# Patient Record
Sex: Female | Born: 1935 | Race: White | Hispanic: No | Smoking: Never smoker
Health system: Southern US, Community
[De-identification: ages and names within clinical notes are randomized; demographics above are authoritative.]

## PROBLEM LIST (undated history)

## (undated) DIAGNOSIS — E039 Hypothyroidism, unspecified: Secondary | ICD-10-CM

## (undated) DIAGNOSIS — Z86711 Personal history of pulmonary embolism: Secondary | ICD-10-CM

## (undated) DIAGNOSIS — S022XXA Fracture of nasal bones, initial encounter for closed fracture: Secondary | ICD-10-CM

## (undated) DIAGNOSIS — I480 Paroxysmal atrial fibrillation: Secondary | ICD-10-CM

## (undated) DIAGNOSIS — Z87898 Personal history of other specified conditions: Secondary | ICD-10-CM

## (undated) DIAGNOSIS — S32000A Wedge compression fracture of unspecified lumbar vertebra, initial encounter for closed fracture: Secondary | ICD-10-CM

## (undated) DIAGNOSIS — J8410 Pulmonary fibrosis, unspecified: Secondary | ICD-10-CM

## (undated) DIAGNOSIS — I272 Pulmonary hypertension, unspecified: Secondary | ICD-10-CM

## (undated) DIAGNOSIS — R296 Repeated falls: Secondary | ICD-10-CM

## (undated) HISTORY — DX: Wedge compression fracture of unspecified lumbar vertebra, initial encounter for closed fracture: S32.000A

## (undated) HISTORY — DX: Pulmonary hypertension, unspecified: I27.20

## (undated) HISTORY — DX: Personal history of pulmonary embolism: Z86.711

## (undated) HISTORY — DX: Hypothyroidism, unspecified: E03.9

## (undated) HISTORY — PX: CHOLECYSTECTOMY: SHX55

## (undated) HISTORY — DX: Repeated falls: R29.6

## (undated) HISTORY — DX: Pulmonary fibrosis, unspecified: J84.10

## (undated) HISTORY — DX: Personal history of other specified conditions: Z87.898

## (undated) HISTORY — DX: Paroxysmal atrial fibrillation: I48.0

## (undated) HISTORY — PX: APPENDECTOMY: SHX54

## (undated) HISTORY — DX: Fracture of nasal bones, initial encounter for closed fracture: S02.2XXA

## (undated) HISTORY — PX: OTHER SURGICAL HISTORY: SHX169

## (undated) HISTORY — PX: TOTAL ABDOMINAL HYSTERECTOMY: SHX209

---

## 2000-11-09 ENCOUNTER — Ambulatory Visit (HOSPITAL_COMMUNITY): Admission: RE | Admit: 2000-11-09 | Discharge: 2000-11-10 | Payer: Self-pay | Admitting: Ophthalmology

## 2000-11-09 ENCOUNTER — Encounter: Payer: Self-pay | Admitting: Ophthalmology

## 2001-08-24 HISTORY — PX: CRANIOTOMY: SHX93

## 2002-03-21 ENCOUNTER — Inpatient Hospital Stay (HOSPITAL_COMMUNITY): Admission: EM | Admit: 2002-03-21 | Discharge: 2002-04-18 | Payer: Self-pay | Admitting: *Deleted

## 2002-03-21 ENCOUNTER — Encounter: Payer: Self-pay | Admitting: Neurosurgery

## 2002-03-22 ENCOUNTER — Encounter: Payer: Self-pay | Admitting: Neurosurgery

## 2002-03-23 ENCOUNTER — Encounter: Payer: Self-pay | Admitting: Neurosurgery

## 2002-03-25 ENCOUNTER — Encounter: Payer: Self-pay | Admitting: Neurosurgery

## 2002-03-27 ENCOUNTER — Encounter: Payer: Self-pay | Admitting: Neurosurgery

## 2002-03-30 ENCOUNTER — Encounter: Payer: Self-pay | Admitting: Neurosurgery

## 2002-04-04 ENCOUNTER — Encounter: Payer: Self-pay | Admitting: Neurosurgery

## 2002-04-04 ENCOUNTER — Encounter: Payer: Self-pay | Admitting: Pulmonary Disease

## 2002-04-05 ENCOUNTER — Encounter: Payer: Self-pay | Admitting: Pulmonary Disease

## 2002-04-05 ENCOUNTER — Encounter: Payer: Self-pay | Admitting: Neurosurgery

## 2002-04-06 ENCOUNTER — Encounter: Payer: Self-pay | Admitting: Neurosurgery

## 2002-04-07 ENCOUNTER — Encounter: Payer: Self-pay | Admitting: Critical Care Medicine

## 2002-04-10 ENCOUNTER — Encounter: Payer: Self-pay | Admitting: Critical Care Medicine

## 2002-04-11 ENCOUNTER — Encounter: Payer: Self-pay | Admitting: Critical Care Medicine

## 2002-04-11 ENCOUNTER — Encounter: Payer: Self-pay | Admitting: Neurosurgery

## 2002-04-12 ENCOUNTER — Encounter: Payer: Self-pay | Admitting: Critical Care Medicine

## 2002-04-13 ENCOUNTER — Encounter: Payer: Self-pay | Admitting: Neurosurgery

## 2002-04-13 ENCOUNTER — Encounter: Payer: Self-pay | Admitting: Critical Care Medicine

## 2002-04-18 ENCOUNTER — Inpatient Hospital Stay (HOSPITAL_COMMUNITY)
Admission: RE | Admit: 2002-04-18 | Discharge: 2002-05-05 | Payer: Self-pay | Admitting: Physical Medicine & Rehabilitation

## 2002-04-25 ENCOUNTER — Encounter: Payer: Self-pay | Admitting: Physical Medicine & Rehabilitation

## 2002-04-28 ENCOUNTER — Encounter: Payer: Self-pay | Admitting: Physical Medicine & Rehabilitation

## 2004-10-28 ENCOUNTER — Encounter: Admission: RE | Admit: 2004-10-28 | Discharge: 2004-10-28 | Payer: Self-pay | Admitting: Neurosurgery

## 2005-11-23 ENCOUNTER — Ambulatory Visit: Payer: Self-pay | Admitting: Cardiology

## 2005-11-26 ENCOUNTER — Ambulatory Visit: Payer: Self-pay | Admitting: Cardiology

## 2005-12-08 ENCOUNTER — Ambulatory Visit: Payer: Self-pay | Admitting: Cardiology

## 2007-03-21 ENCOUNTER — Ambulatory Visit: Payer: Self-pay | Admitting: Family Medicine

## 2007-03-21 DIAGNOSIS — R0609 Other forms of dyspnea: Secondary | ICD-10-CM | POA: Insufficient documentation

## 2007-03-21 DIAGNOSIS — R030 Elevated blood-pressure reading, without diagnosis of hypertension: Secondary | ICD-10-CM | POA: Insufficient documentation

## 2007-03-21 DIAGNOSIS — R5383 Other fatigue: Secondary | ICD-10-CM

## 2007-03-21 DIAGNOSIS — R5381 Other malaise: Secondary | ICD-10-CM | POA: Insufficient documentation

## 2007-03-21 DIAGNOSIS — R0989 Other specified symptoms and signs involving the circulatory and respiratory systems: Secondary | ICD-10-CM

## 2007-03-22 ENCOUNTER — Ambulatory Visit (HOSPITAL_COMMUNITY): Admission: RE | Admit: 2007-03-22 | Discharge: 2007-03-22 | Payer: Self-pay | Admitting: Family Medicine

## 2007-03-22 ENCOUNTER — Telehealth (INDEPENDENT_AMBULATORY_CARE_PROVIDER_SITE_OTHER): Payer: Self-pay | Admitting: *Deleted

## 2007-03-22 ENCOUNTER — Encounter (INDEPENDENT_AMBULATORY_CARE_PROVIDER_SITE_OTHER): Payer: Self-pay | Admitting: Family Medicine

## 2007-03-24 ENCOUNTER — Telehealth (INDEPENDENT_AMBULATORY_CARE_PROVIDER_SITE_OTHER): Payer: Self-pay | Admitting: *Deleted

## 2007-03-24 ENCOUNTER — Encounter (INDEPENDENT_AMBULATORY_CARE_PROVIDER_SITE_OTHER): Payer: Self-pay | Admitting: Family Medicine

## 2007-03-25 ENCOUNTER — Encounter (INDEPENDENT_AMBULATORY_CARE_PROVIDER_SITE_OTHER): Payer: Self-pay | Admitting: Family Medicine

## 2007-03-28 ENCOUNTER — Ambulatory Visit: Payer: Self-pay | Admitting: Family Medicine

## 2007-03-28 DIAGNOSIS — I82409 Acute embolism and thrombosis of unspecified deep veins of unspecified lower extremity: Secondary | ICD-10-CM | POA: Insufficient documentation

## 2007-03-28 DIAGNOSIS — I270 Primary pulmonary hypertension: Secondary | ICD-10-CM

## 2007-03-28 DIAGNOSIS — E079 Disorder of thyroid, unspecified: Secondary | ICD-10-CM | POA: Insufficient documentation

## 2007-03-28 LAB — CONVERTED CEMR LAB
HDL goal, serum: 40 mg/dL
LDL Goal: 160 mg/dL

## 2007-03-29 ENCOUNTER — Encounter (INDEPENDENT_AMBULATORY_CARE_PROVIDER_SITE_OTHER): Payer: Self-pay | Admitting: Family Medicine

## 2007-03-29 ENCOUNTER — Telehealth (INDEPENDENT_AMBULATORY_CARE_PROVIDER_SITE_OTHER): Payer: Self-pay | Admitting: *Deleted

## 2007-03-29 LAB — CONVERTED CEMR LAB
T3, Free: 2.9 pg/mL (ref 2.3–4.2)
TSH: 8.585 microintl units/mL — ABNORMAL HIGH (ref 0.350–5.50)

## 2007-04-04 ENCOUNTER — Encounter (INDEPENDENT_AMBULATORY_CARE_PROVIDER_SITE_OTHER): Payer: Self-pay | Admitting: Family Medicine

## 2007-04-04 ENCOUNTER — Ambulatory Visit (HOSPITAL_COMMUNITY): Admission: RE | Admit: 2007-04-04 | Discharge: 2007-04-04 | Payer: Self-pay | Admitting: *Deleted

## 2007-04-12 ENCOUNTER — Telehealth (INDEPENDENT_AMBULATORY_CARE_PROVIDER_SITE_OTHER): Payer: Self-pay | Admitting: Family Medicine

## 2007-04-20 ENCOUNTER — Encounter (INDEPENDENT_AMBULATORY_CARE_PROVIDER_SITE_OTHER): Payer: Self-pay | Admitting: Family Medicine

## 2007-05-11 ENCOUNTER — Encounter (INDEPENDENT_AMBULATORY_CARE_PROVIDER_SITE_OTHER): Payer: Self-pay | Admitting: Family Medicine

## 2007-06-27 ENCOUNTER — Telehealth (INDEPENDENT_AMBULATORY_CARE_PROVIDER_SITE_OTHER): Payer: Self-pay | Admitting: Family Medicine

## 2009-04-10 IMAGING — CR DG CHEST 2V
2 series · 2 of 2 positions shown · non-contrast
Comparison: none

HISTORY: Dyspnea on exertion

[view not recorded (1 of 2)]
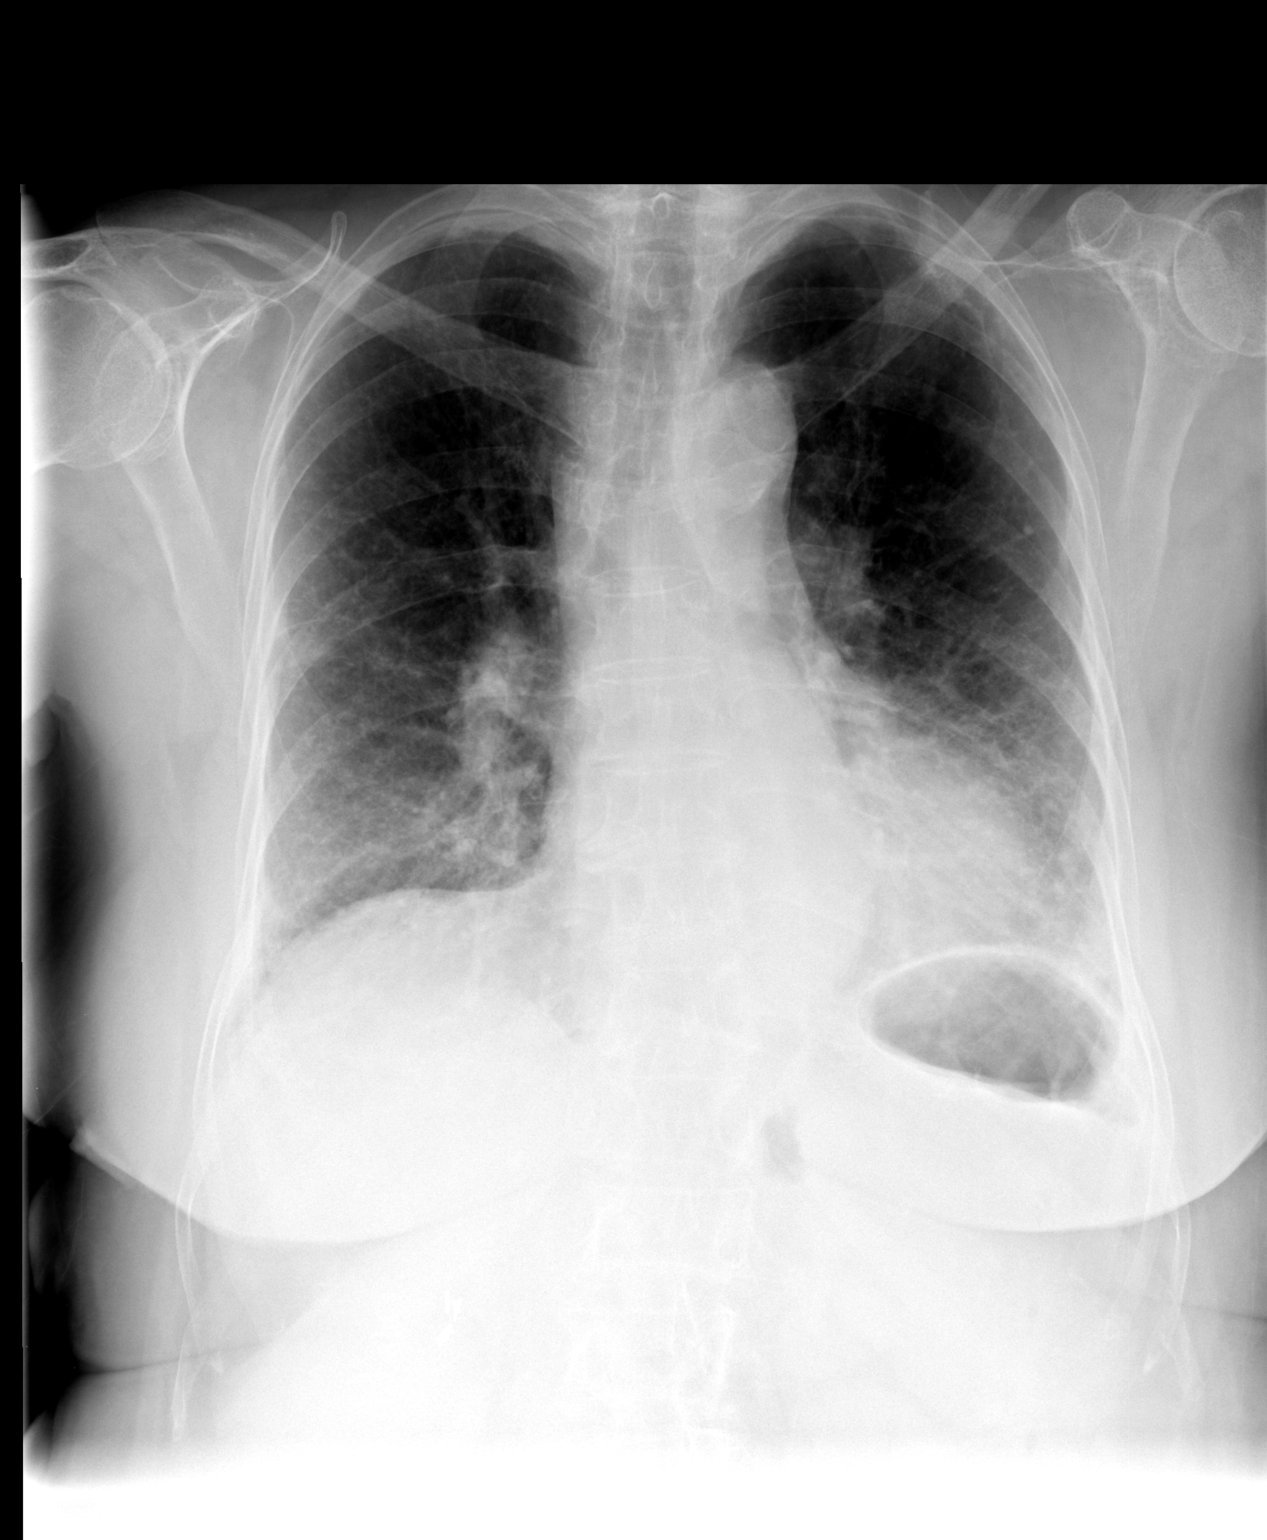

[view not recorded (2 of 2)]
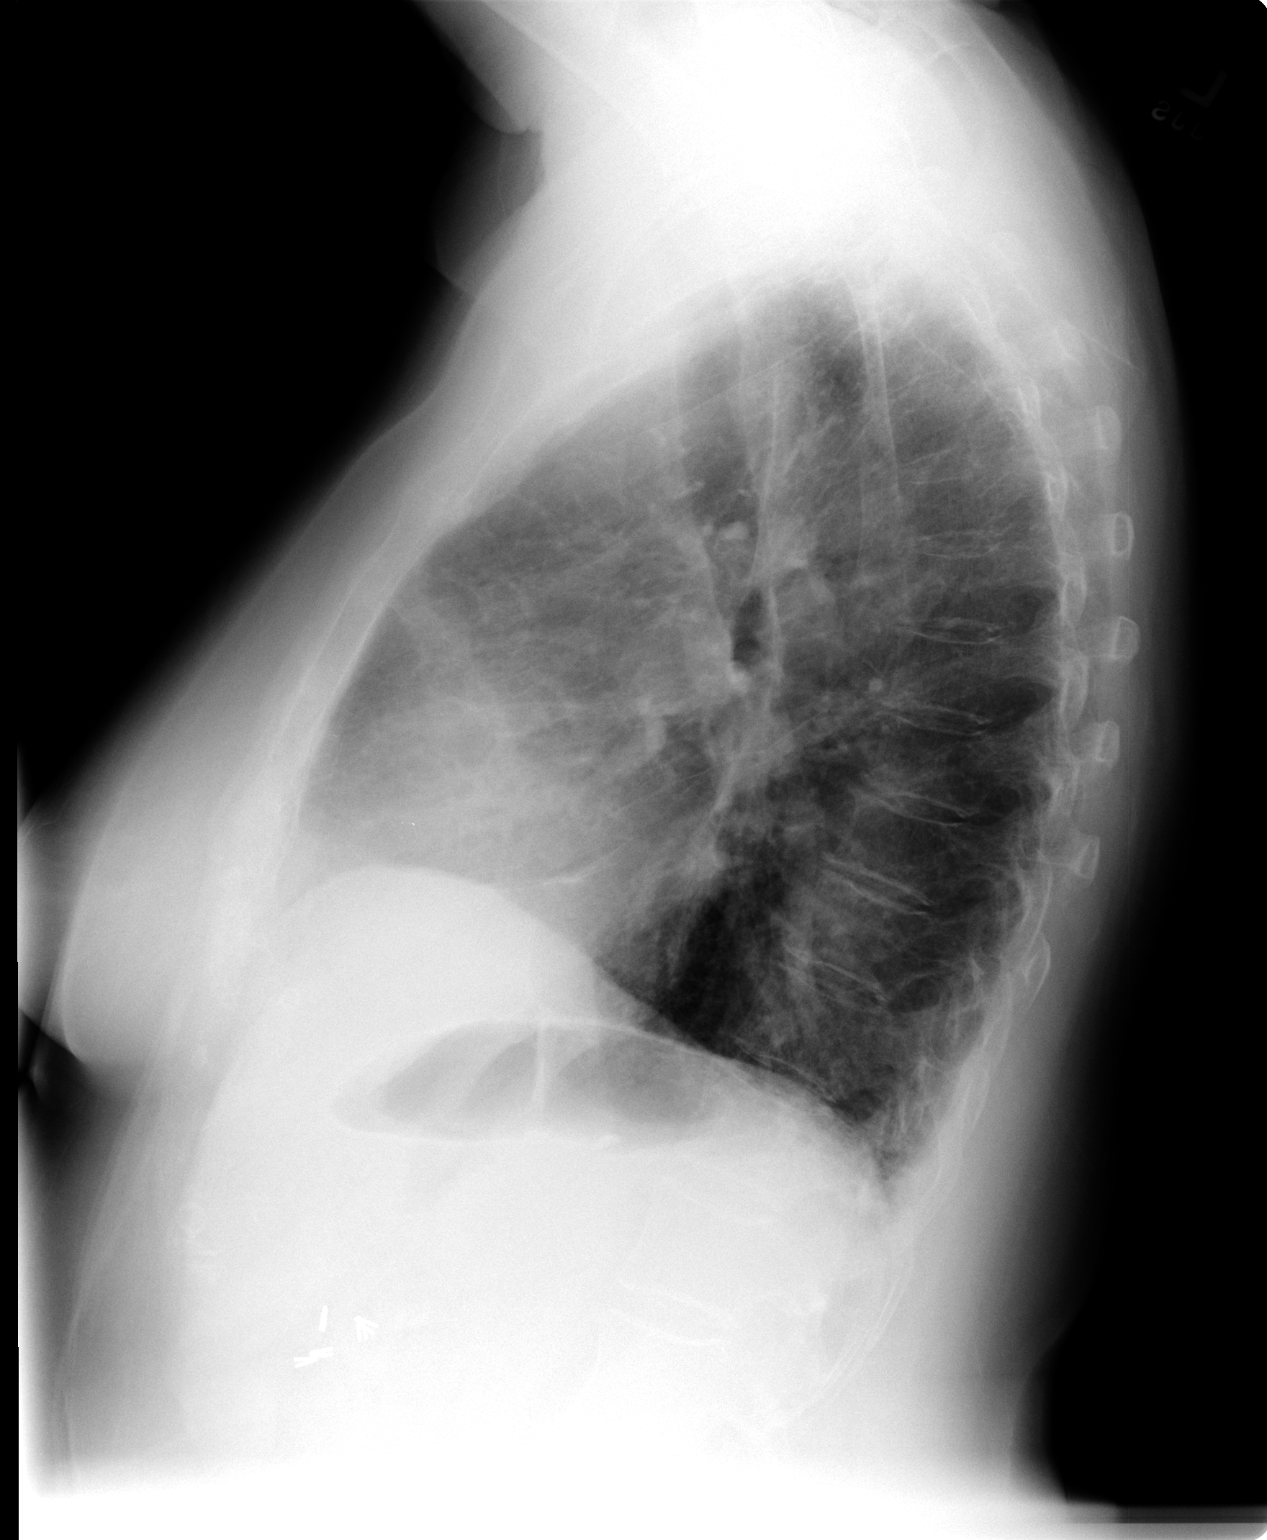

[2 of 2 positions shown; findings below may reference images not displayed]

CHEST 2 VIEWS:

No prior exams available for comparison.

Cardiac enlargement.
Tortuous aorta with atherosclerotic calcification at arch.
Mediastinal contours otherwise normal.
Slight pulmonary vascular congestion.
Mild interstitial infiltrates in perihilar and basilar regions, question edema
versus infection.
No pleural effusion or pneumothorax.
Calcified granuloma left upper lobe.
Left apex scarring.
Bony demineralization with minimal scoliosis.
IMPRESSION: Cardiomegaly with pulmonary vascular congestion and diffuse interstitial
infiltrates, question edema versus infection.

## 2010-09-21 LAB — CONVERTED CEMR LAB
AST: 22 units/L (ref 0–37)
Albumin: 3.8 g/dL (ref 3.5–5.2)
BUN: 23 mg/dL (ref 6–23)
CO2: 28 meq/L (ref 19–32)
Calcium: 9.2 mg/dL (ref 8.4–10.5)
Chloride: 105 meq/L (ref 96–112)
Cholesterol: 179 mg/dL (ref 0–200)
Creatinine, Ser: 0.66 mg/dL (ref 0.40–1.20)
Glucose, Bld: 96 mg/dL (ref 70–99)
Hemoglobin: 13.8 g/dL (ref 12.0–15.0)
LDL Cholesterol: 114 mg/dL — ABNORMAL HIGH (ref 0–99)
Lymphs Abs: 2 10*3/uL (ref 0.7–3.3)
Monocytes Absolute: 0.6 10*3/uL (ref 0.2–0.7)
Monocytes Relative: 12 % — ABNORMAL HIGH (ref 3–11)
Neutro Abs: 2.1 10*3/uL (ref 1.7–7.7)
Neutrophils Relative %: 42 % — ABNORMAL LOW (ref 43–77)
Potassium: 4 meq/L (ref 3.5–5.3)
Pro B Natriuretic peptide (BNP): 55.3 pg/mL (ref 0.0–100.0)
RBC: 4.41 M/uL (ref 3.87–5.11)
Triglycerides: 115 mg/dL (ref <150)
WBC: 4.9 10*3/uL (ref 4.0–10.5)

## 2011-01-09 NOTE — Discharge Summary (Signed)
[place]. [facility]  Patient:    Crystal Cisneros, Crystal Cisneros                      MRN: [id number] Adm. Date:  [id number] Disc. Date: [id number] Attending:  Ivor Messier CC:         Antony Salmon. Celene Skeen, M.D., [address], [address] [place], [place] [address]             Delon Sacramento, M.D.   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  HOSPITAL COURSE:  The patient was evaluated preoperatively and felt to be in satisfactory condition for the purposed surgery. The patient, therefore, was taken into the operating room where, under general anesthesia, a posterior vitrectomy was performed with associated membrane peeling and endolaser photocoagulation around several areas of picked retinal picks. A diaphanous membrane was lifted from the posterior retinal surface and removed. A vitreous air exchange was performed. Following the hour and a half procedure the patient was transferred to the recovery room and subsequently to the 23 hour observation unit. The patient was placed on her right side or face down following this vitreous air exchange. The patient was seen on the evening of surgery and felt to be cooperating with the positioning. Indirect ophthalmoscopy revealed an air filled eye and the retinal in place with laser endophotocoagulation burns around the areas of retinal picks outside the macula. The patient was again seen on the morning following surgery and, therefore, discharged home to be followed in the office. The patient and her husband have been given a printed list of discharge instructions on the care and use of the operated eye.  DISCHARGE OCULAR MEDICATIONS:  Tobradex and Cyclomydril ophthalmic solution one drop four times a day 5 minute apart. Maxitrol and Atropine  ointment at bed time.  CONDITION ON DISCHARGE:  ? improved. Time will tell whether visual function will be restored in this patient with the removal of her epiretinal membrane.  FOLLOWUP:  Appointment in my office in five to seven days. The patient to continue face down on right side positioning. DD:  11/09/00 TD:  11/10/00 Job: [id number]

## 2011-01-09 NOTE — H&P (Signed)
[place]. [facility]  Patient:    Crystal Cisneros, Crystal Cisneros                      MRN: [id number] Adm. Date:  [id number] Disc. Date: [id number] Attending:  Ivor Messier CC:         Antony Salmon. Celene Skeen, M.D., [address], [address] [place], [place] [address]             Delon Sacramento, M.D.   History and Physical  HISTORY OF PRESENT ILLNESS:  This patient has a past history of cataract implant surgery of the left eye on October 08, 1998 by her regular ophthalmologist, Dr. Glenford Peers of [place]. 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 The patient was first seen in my office on December 16, 1998. At that time, her vision was recorded at 20/40 right eye, 20/50 left eye; with correction 20/30 right eye, 20/50 left eye with head turn. Near vision 20/50 left eye. Applanation tonometry 15 mm right eye, 16 left eye. Slit lamp examination revealed nuclear cataract formation in the right eye and a clear posterior chamber intraocular lens implant in the left eye. The posterior capsule was clear. Detailed fundus examination with indirect ophthalmoscopy and Goldman three mirror contact lens biomicroscopy revealed the vitreous to be clear, the retina attached. The right eye revealed a small one disk diameter choroidal nevus inferior to the macula. The left eye revealed a glassy macula with preretinal membrane formation. It was felt that cystoid macular edema was not present. It was suggested that fluorescein angiography be performed, and this revealed no cystoid macular edema pattern. Over the ensuing year the vision in the left eye continued to deteriorate to 20/100 with best correction. It was elected, therefore, in view of the decreasing vision, to discuss vitrectomy with preretinal membrane peeling with the patient. She elected to  proceed with the surgery, as outlined, understanding the possible complications. She signed an informed consent and arrangements were made for outpatient admission, at this time.  PAST MEDICAL HISTORY:  The patient is in stable general health under the care of Dr. Byrd Hesselbach A. Celene Skeen. The patient has a past history of right bundle branch block on EKG and history of 30% left internal carotid stenosis. Dr. Celene Skeen feels the patient has an average risk for the patients age concerning the purposed surgery under general anesthesia.  REVIEW OF SYSTEMS:  No current cardiorespiratory complaints.  PHYSICAL EXAMINATION:  VITAL SIGNS:  As recorded on admission; pulse 82, blood pressure 120/65, temperature 98.5, respiratory rate 16.  GENERAL APPEARANCE:  The patient is a pleasant well-nourished, well-developed 77 year old white female in no acute distress.  HEENT:  Eyes; visual acuity has continued to deteriorate in the left eye to 20/200 with similar retinal findings of fibrosis in the macular area.  CHEST AND LUNGS:  Clear to percussion and auscultation.  HEART:  Normal sinus rhythm. No cardiomegaly. No murmurs.  ABDOMEN:  Negative.  EXTREMITIES:  Negative.  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  SURGICAL PLAN:  Pars plana vitrectomy using vitreous infusion suction cutter and preretinal membrane peeling and excision. DD:  11/09/00 TD:  11/10/00 Job: [id number]

## 2011-01-09 NOTE — Discharge Summary (Signed)
NAME:  Crystal Cisneros, Crystal Cisneros                         ACCOUNT NO.:  [id number]   MEDICAL RECORD NO.:  [id number]                   PATIENT TYPE:  IPS   LOCATION:  4002                                 FACILITY:  [facility]   PHYSICIAN:  Ranelle Oyster, M.D.             DATE OF BIRTH:  [birth year]   DATE OF ADMISSION:  04/18/2002  DATE OF DISCHARGE:  05/05/2002                                 DISCHARGE SUMMARY   DISCHARGE DIAGNOSES:  1. Subdural hematoma after fall, status post craniotomy 7/24.  2. Bilateral pulmonary embolism with inferior vena cava filter.  3. Anorexia/very poor intake.  4. Increased temperature.   HISTORY OF PRESENT ILLNESS:  The patient is a 77 year old white female  transferred from [facility] on 03/21/2002 for further evaluation of right  subdural hematoma after fall striking head. The patient underwent a  craniotomy and evacuation of subdural on 03/21/2002 by Dr. Trey Sailors. Hospital  course significant for bilateral PE/VDRF, ileus status post Panda tube. The  patient underwent an IVC filter placement on 04/04/2002. PT report at this  time indicates the patient can transfer, sit to stand, 2+ from bed with max  assist, pivot from chair to bed to chair. The patient with more than  approximately 40% of the work, bed mobility modified assist. The hospital  course is also significant for hypoglycemia secondary to steroids and  elevated FSTs. The patient was transfer to [facility] on  04/18/2002.   PAST MEDICAL HISTORY:  Unremarkable except for syncope episodes.   PAST SURGICAL HISTORY:  Significant for a cholecystectomy, tonsillectomy,  hysterectomy, appendectomy.   PRIMARY CARE PHYSICIAN:  Darrelyn Hillock, M.D. at [facility].   CARDIOLOGIST:  Learta Codding, M.D. [facility] at [facility].   ALLERGIES:  SULFA.   FAMILY HISTORY:  Significant for a CVA.   MEDICATIONS:  Medications prior to admission Premarin, Aleve, multivitamins.   SOCIAL HISTORY:  The patient lives in a one-level home with boyfriend. Four  steps to entry. Boyfriend able to assist. No tobacco or alcohol use. Retired  Physiological scientist. She has five children.   REVIEW OF SYMPTOMS:  Denies any chest pain, shortness of breath, denies any  vomiting. Significant for syncope episodes.   HOSPITAL COURSE:  The patient was admitted to [facility] Rehab Department on  04/18/2002 for comprehensive patient rehabilitation where she received more  than three hours of PT, OT, and speech therapy daily. Her hospital course  was significant for the following:  1. SDH status post craniotomy. Overall the patient made fair progress while     in rehab. The patient seemed to participate in therapy a lot better while     her boyfriend was available. She had no significant new neurological     symptoms while in rehab. The first few days of rehab she had a very flat     affect and was very sedated.  A sleep and awake chart was performed daily.     The patient was started on Ritalin 5 mg on 04/19/2002 at 7 a.m. and 12     noon to increase awareness and alertness. During the first few days of     Ritalin she became more alert and was able to communicate better. The     Ritalin was increased further on 04/24/2002 to 10 mg b.i.d. q.7, q.12.     Unfortunately after the increase in Ritalin the patient became dizzy and     complained of significant headache, therefore the Ritalin was brought     back down to 5 mg 7 a.m. and 12 p.m. However, the Ritalin had to be     eventually discontinued due to poor intake. Orthostatics were performed     x1 day to rule out hypertension as a source of dizziness. The patient had     no significant orthostasis. The patient had repeat head CT performed on     04/28/2002 to evaluate a subdural. Head CT demonstrated evolutionary     changes in the right side, a subdural hematoma, a small component of     chronic subdural hematoma along the right parietal lobe,  which is new,     but which only measures approximately 3 mm and which is a low density and     evolutionary changes in the right frontal and temporal encephalomalacia.     The patient was discharged in a mini assist to supervision level.  2. Bilateral PE and IVC. The patient was not restarted on Coumadin due to     the safety issues. The patient experienced no shortness of breath while     in rehab. The patient did have an IVC filter prior to admission to rehab.  3. F/E/N. Anorexia and poor appetite is the main reason the patient did not     progress in rehab. She has been consulted by a nutritionist. We have     encouraged the patient to increase intake. She had been started on a     dysphagia I diet and thick liquids at the time of admission. She was     placed on IV fluids 7 P to 7 A  to help her hydration. The patient was     encouraged to eat as much as possible. Diet was finally upgraded to     dysphagia II and thin liquids on 04/25/2002. The patient was also started     on Megace 40 mg p.o. b.i.d. on 04/24/2002 as an appetite stimulator. Never     the less the patient still did as throughout her whole rehab stay and     only ate about 5-10% of her meals.  Before discharge the patient was also     started on Remeron 15 minutes q.h.s. on 05/02/2002. IV fluids were     discontinue once the patient is on a thin liquid diet. The latest     prealbumin was 5.5. The patient's family is aware of the significance of     the decrease in appetite and decreased p.o. intake and understand the     severeness of this if she does not improve. Possibly more aggressiveness     will have to be performed such as a PEG tube or NG tube if the patient     does not improve. Prealbumin levels will be performed on an outpatient     basis.  4. Increased temperature.  The  patient had an increased temperature right     prior to discharge of 100.5. Urinalysis and urine culture were performed.    The rest of the exam  was unremarkable. The patient had no significant     diaphoresis noted. There were no significant other issues that occurred     while the patient was in rehab.  5. Urinary tract infection.  The patient was started on Cipro on 04/21/2002     250 mg p.o. b.i.d. and the Cipro was finally discontinued on 04/27/2002.   LABORATORY DATA:  The latest labs indicated that her sodium level was 138,  potassium 3.4, glucose 87, BUN 11, creatinine 0.4, prealbumin was 4.9 on  05/03/2002 was 5.5. White blood cell count was 4.6, hemoglobin 11.1,  hematocrit 33.9, platelet count 196. Hemoglobin A1C was normal on 04/19/2002  at 6.0. She had a urine culture performed on 04/19/2002, which showed  enterococcus species 75,000 colonies. The patient was placed on a short  course of antibiotics for this, but later discontinued.   PT report indicated that at the time of discharge vitals were temperature  100.5, blood pressure 117/54, respiratory rate 18, pulse 112. PT report at  this time indicates the patient is ambulating approximately 40 feet with  minimal assist. She can from a chair sit to stand with minimal assist. Bed  mobility with supervision. The patient performed most ADLs at min assist  with supervision level. Overall the patient progressed slow secondary to the  patient's reluctance. To participate completely during treatment. The  patient discharged home at supervision level, 24 hour supervision with  assistance. The patient made steady progress towards goals as far as PT. The  patient requires 24 hour supervision to ambulate and perform all functional  activities for increased safety and to decrease risk of falls. The reason  why the goals are not met is because the patient is not able to decipher  deficits without maximum questioning. The patient unaware of deficits at  this time. The patient exhibits small gains since admission. She is now able  to tolerate dysphagia II texture with thin liquids. With  regard to cognition  she computes minimal complex organization problem solving tasks with minimal  questions. The patient overall why goals not met she has decreased  motivation and participation, decreased aware of deficits and affects of  functional skills. The patient was discharged home with her boyfriend.   DISCHARGE MEDICATIONS:  Remeron 15 mg p.o. q.h.s., and multivitamins p.o.  q.d.   DIET:  Ensure or Carnation Breakfast three times a day.   ACTIVITY:  No drinking, no driving, no smoking, use walker. The patient is  on a mechanical soft diet, thin liquids, eat three meals a day, document  food intake. [facility] for nurse. PT, OT, and speech CNA. The  patient to follow up with Dr. Faith Rogue on 06/21/2002 at 10 a.m. Follow-  up with her primary care Jontavia Leatherbury within two weeks and monitor her intake  and monitor her fever. The patient is to follow up with neurosurgeon Dr.  Trey Sailors within three weeks.    Junie Bame, P.A.                       Ranelle Oyster, M.D.    LH/MEDQ  D:  05/05/2002  T:  05/05/2002  Job:  [id number]   cc:   Payton Doughty, M.D.   Ranelle Oyster, M.D.  [address]  [address]  [place]  [place] [address]  Fax: [phone]   Darrelyn Hillock, M.D.  [facility]

## 2011-01-09 NOTE — Op Note (Signed)
[place]. [facility]  Patient:    Crystal Cisneros, Crystal Cisneros                      MRN: [id number] Proc. Date: 11/09/00 Attending:  Guadelupe Sabin, M.D. CC:         Antony Salmon. Celene Skeen, M.D., [address], [place], [place] [address]             Delon Sacramento, M.D.   Operative Report  PREOPERATIVE DIAGNOSES:  Macular pucker syndrome left eye, pseudophakia left eye.  POSTOPERATIVE DIAGNOSES:  Macular pucker syndrome left eye, pseudophakia left eye.  OPERATION:  Posterior vitrectomy through pars plana using vitreous infusion suction cutter, preretinal membrane peeling and excision, endolaser photocoagulation, vitreous air exchange.  SURGEON:  Guadelupe Sabin, M.D.  ASSISTANT:  Nurse.  ANESTHESIA:  General.  OPHTHALMOSCOPY:  As previously described.  DESCRIPTION OF PROCEDURE:  After the patient was prepped and draped a speculum was inserted in the left eye. The eye was turned downward and a superior rectus traction suture placed. A peritomy was performed adjacent to the limbus from the 9:30 to 4 oclock position. Three sclerotomy sites were then prepared at the 10, 2 and 4 oclock position 3.5 mm from the limbus using the MVR blade and 20 gauge needle. The 4 mm vitreous infusion terminal was secured in place at the 4 oclock position with the 5-0 white mattress Dacron suture. The fiberoptic light pipe was inserted at the 2 oclock position and a hand pieced vitreous infusion suction cutter at the 10 oclock position. Slow vitreous infusion suction cutting at a high rate, 1800 cuts/min, was slowly achieved removing the central core of the vitreous. As the retinal was then approached the glassy slightly greyish appearance of the macula was noted. Using the Rice pick several attempts were made to remove the preretinal membrane. Tissue was elevated and there was some shredding of the intraocular membrane. Some areas were associated with  retinal punctate bleeding and possible retinal hole formation temporal to the macula. There suddenly appeared, after considerable manipulation, a diaphanous membrane in the mid vitreous, which had elevated from the retinal surface. No further attempts were made at removal of the posterior retinal membrane fearing that possible damage to the macular area could occur. It was, therefore, elected to close. Indirect ophthalmoscopy was performed with scleral depression revealing no peripheral retinal tear or detachment areas. The vitreous cutter was then reintroduced and the diaphanous membrane cut and removed. It was then elected, in view of the several retinal picks, to perform a vitreous air exchange. This was performed using the hand piece of the vitreous infusion suction cutter, and then as the retina was approached by the brush [facility] brush tip aspirator the vitreous fluid was removed and air replaced. The vitreous instruments were then withdrawn from the vitreous cavity and the sclerotomy sites closed with 7-0 Vicryl sutures. The conjunctiva was then closed with a running 7-0 Vicryl suture. Depo-Garamycin and Dexamethasone were injected in the subtenon space inferiorly. Maxitrol and Atropine ointment were instilled in the conjunctival cul-de-sac. A light patch and protector shield were applied. Duration of procedure one and one half hours. The patient tolerated the procedure well in general and left the operating room for the recovery room and subsequently to the 23 hour observation unit. DD:  11/09/00 TD:  11/10/00 Job: [id number]

## 2011-01-09 NOTE — Op Note (Signed)
NAME:  Crystal Cisneros, Crystal Cisneros                         ACCOUNT NO.:  [id number]   MEDICAL RECORD NO.:  [id number]                   PATIENT TYPE:  INP   LOCATION:  1832                                 FACILITY:  [facility]   PHYSICIAN:  Payton Doughty, M.D.                   DATE OF BIRTH:  09/13/1924   DATE OF PROCEDURE:  03/21/2002  DATE OF DISCHARGE:                                 OPERATIVE REPORT   PREOPERATIVE DIAGNOSIS:  Right acute subdural hematoma.   POSTOPERATIVE DIAGNOSES:  1. Right acute subdural hematoma.  2. Right frontal intraparenchymal hematoma.   PROCEDURE:  Right pterional craniotomy for subdural hematoma resection.   SURGEON:  Payton Doughty, M.D.   ANESTHESIA:  General endotracheal.   PREPARATION:  Betadine prep and scrubbed with alcohol wipe.   COMPLICATIONS:  None.   ASSISTANT:  [place]   INDICATIONS FOR PROCEDURE:  This is a 77 year old right-handed white lady  with a hematoma two days after a fall.   DESCRIPTION OF PROCEDURE:  She was taken to the operating room, smoothly  anesthetized, and intubated, and placed supine on the operating table with  the head turned toward the left.  Following shave, prepped and draped in the  usual sterile fashion.  The skin was infiltrated with 1% lidocaine with  1:500,000 epinephrine.  The skin was incised starting from terrages and  carried superiorly to the superior temporal line and then forward to the  hairline at the midline.  This flap was taken forward along with the  attached temporalis muscle.  Bur holes were then placed at the keyhole, one  along the temporal squama, and one posterior at the superior temporal line.  The pterional craniotomy was completed with the craniotome.  Brisk bleeding  was encountered at two spots. One was arachnoid granulation that was  tamponaded with thrombin Gelfoam and dural tacking stitches.  The other was  the superior temporal middle meningeal artery which was bipolared.  Dural  tacking  stitches were then taken circumferentially and the dura opened.  This was immediately productive of a large amount of subacute blood.  Working frontally there was a large clot identified frontally, approximately  3x3 cm in the right frontal lobe.  This was removed. Hemostasis was achieved  and the cavity carefully inspected.  Meticulous hemostasis was then obtained  at the temporal tip which also demonstrated some bleeding.  The wound was  irrigated until it was clear.  Hemostasis was assured.  The drain was placed  into the right temporal fossa and exited via a separate incision.  The galea  was then reapproximated with 4-0 nylon in interrupted fashion.  The bone was  reattached with the Osteomed plates.  The  galea was reapproximated with 3-0 Vicryl in interrupted fashion.  The skin  was closed with 3-0 nylon in running locking fashion.  Bacitracin and Telfa  dressing was applied  and the standard head wrap applied.  The drain was  secured with a single 3-0 nylon.  The patient returned to the recovery room  in good condition.                                                   Payton Doughty, M.D.    MWR/MEDQ  D:  03/21/2002  T:  03/24/2002  Job:  [id number]

## 2011-01-09 NOTE — Discharge Summary (Signed)
NAME:  Crystal Cisneros, Crystal Cisneros                         ACCOUNT NO.:  [id number]   MEDICAL RECORD NO.:  [id number]                   PATIENT TYPE:   LOCATION:                                       FACILITY:   PHYSICIAN:  Payton Doughty, M.D.                   DATE OF BIRTH:  1925/07/21   DATE OF ADMISSION:  03/21/2002  DATE OF DISCHARGE:  04/18/2002                                 DISCHARGE SUMMARY   ADMISSION DIAGNOSIS:  Subdural hematoma.   DISCHARGE DIAGNOSIS:  Subdural hematoma.   PROCEDURE:  Evacuation of subdural hematoma and tracheostomy.   SERVICE:  Neurosurgery.   COMPLICATIONS:  None.   HISTORY OF PRESENT ILLNESS:  A 77 year old right-handed white female who  fell prior to admission.  A couple of days was up at [facility],  having struck her head.  CT scan after several days demonstrated right  femoral subdural hematoma and she was transferred to [facility].   PAST MEDICAL HISTORY:  Unremarkable.   PAST SURGICAL HISTORY:  Hysterectomy and cholecystectomy.   ALLERGIES:  SULFA.   MEDICATIONS:  Over-the-counter medications only.   PHYSICAL EXAMINATION:  General examination was intact.  Neurologically, she  had a left pronator drift and an acute subdural on the right side  approximately 2 cm.  She was taken to the operating room and underwent a  right craniotomy for subdural.  Postoperatively she did reasonably well.  She had some seizures which required her to be on anticonvulsants.  She  developed some syncopal episode and had difficulty with some swallowing. She  is getting up and about.  CT showed improvement in her brain with some  cortical contusion.  She was started on tube feeds.  As she was up and able  to move about, doing well and she had oxygen saturation and was reintubated.  She was followed by critical care when she was on the ventilator and the  Dilantin was stopped.  She was replaced with phenobarbital.  She continued  to be  awake and follow commands.  She was able to be extubated.  Her  strength had continued to improve.  Because she was awake and oriented and  beginning to eat, she was visited by the rehab folks and felt to be a  reasonable candidate for rehabilitation upstairs.  This occurred on April 18, 2002.  As she was transferred, she was awake, alert and oriented.  She  had no further difficulties on our floor.   FOLLOW UP:  This will be in the [facility] office  when she is released from rehab.  Payton Doughty, M.D.   MWR/MEDQ  D:  08/09/2002  T:  08/10/2002  Job:  [id number]

## 2011-01-09 NOTE — H&P (Signed)
NAME:  Crystal Cisneros, Crystal Cisneros                         ACCOUNT NO.:  [id number]   MEDICAL RECORD NO.:  [id number]                   PATIENT TYPE:  INP   LOCATION:  3112                                 FACILITY:  [facility]   PHYSICIAN:  Payton Doughty, M.D.                   DATE OF BIRTH:  [birth year]   DATE OF ADMISSION:  03/21/2002  DATE OF DISCHARGE:                                HISTORY & PHYSICAL   ADMISSION DIAGNOSIS:  Subdural hematoma.   HISTORY OF PRESENT ILLNESS:  I was asked to see by the [facility] doctor.  This 77 year old, right-handed, white lady fell Sunday night on the 27th and  hit the back of her head .  She was admitted to [facility].  She had  diminished level of consciousness, but has been responsive, but just sleepy  every since the incidence.  CT today showed a right subdural hematoma and  she is transferred to [facility].   PAST SURGICAL HISTORY:  1. Cholecystectomy.  2. Hysterectomy.   ALLERGIES:  SULFA.   MEDICATIONS:  She takes over-the-counter medications only and not very much  of them.   SOCIAL HISTORY:  She does not smoke or drink.   PHYSICAL EXAMINATION:  HEENT:  Within normal limits.  NECK:  She has reasonable range of motion in her neck with a little bit of  bump on the back of her head where she hit her head.  CHEST:  Clear.  CARDIAC:  Regular rate and rhythm.  ABDOMEN:  Nontender with no hepatosplenomegaly.  EXTREMITIES:  Without clubbing, cyanosis or edema.  Peripheral pulses good.  GENITALIA:  Referred.  NEUROLOGIC:  She is awake, but drifts rapidly off to sleep.  She is oriented  to everything except the month.  Pupils equal, round and reactive to light,  extraocular movements intact.  Facial movements and sensation are intact.  Tongue is midline.  Shoulder shrug normal.  She describes no swallowing  difficulties.  She has a left pronator drift.  She did not walk because of  diminished level of consciousness.  She has an upgoing toe on the  left,  other than this her reflexes are intact.   LABORATORY DATA AND X-RAY FINDINGS:  CT shows a right frontal subdural with  probably a small intraparenchymal contusion as well.  This is a contrecoup  injury from her occipital injury.   CLINICAL IMPRESSION:  Subdural hematoma after her closed head injury.    PLAN:  She has enough deficit in level of consciousness and left pronator  drift, so I think she should go to the operating room and have the clot  evacuated.  This was discussed with her and her family and they agreed.  Payton Doughty, M.D.    MWR/MEDQ  D:  03/21/2002  T:  03/22/2002  Job:  [id number]

## 2011-01-09 NOTE — Consult Note (Signed)
NAME:  Crystal Cisneros, Crystal Cisneros                         ACCOUNT NO.:  [id number]   MEDICAL RECORD NO.:  [id number]                   PATIENT TYPE:  INP   LOCATION:  3112                                 FACILITY:  [facility]   PHYSICIAN:  Doylene Canning. Ladona Ridgel, M.D. [facility]           DATE OF BIRTH:  10-Jun-1925   DATE OF CONSULTATION:  DATE OF DISCHARGE:                                   CONSULTATION   INDICATIONS FOR CONSULTATION:  Unexplained syncope.   HISTORY OF PRESENT ILLNESS:  The patient is a very pleasant 77 year old  woman who is status post a syncopal episode resulting in a subdural hematoma  with brain swelling status post evacuation.  Postoperatively she has had  very slow increase in improvement in her mental status.  She is unable to  provide much in the way of history but reports from EMS as well as from the  hospital records, she was standing and became somewhat dizzy and light-  headed and passed out.  The time that she was down is unclear.  She was  admitted to the hospital and subsequently has demonstrated preserved LV  systolic function by 2-D echo and no evidence of ischemia by Cardiolite  stress test.  On telemetry monitoring she has had a single, very brief  episode of atrial fibrillation lasting approximately five seconds.  She has  bifascicular block with right bundle and left axis deviation but has had not  other more high grade episodes of heart block.   PAST MEDICAL HISTORY:  Notable for no history of heart problems.  She is  status post cholecystectomy, status post tonsillectomy, status post  hysterectomy.   PRESENT MEDICATIONS:  Dilantin, Premarin, Colace and Zinacef.   SOCIAL HISTORY:  The patient lives in [place] and she is a retired Location manager.  She denies tobacco or ethanol abuse.   FAMILY HISTORY:  Notable for mother who died of a stroke.  The father's  family history is unknown.  She has one sister who is in good health.   REVIEW OF SYMPTOMS:  She denies  fevers, chills, night sweats but again she  has very poor ability to answer these questions.  She does have intermittent  headaches.  There are no vision or hearing problems.  There are no skin  rashes or lesions.  She denies chest pain, shortness of breath, dyspnea on  exertion, PND or orthopnea.  She denies joint deformities.  She does have  occasional arthralgias.  She denies dyspnea, hematuria or nocturia.  There  is no nausea, vomiting, diarrhea or constipation.  She presently has diffuse  weakness, worse in the lower extremities.   PHYSICAL EXAMINATION:  GENERAL:  She is arousable but fairly somnolent.  She  denies any present pain.  VITAL SIGNS:  Blood pressure was 130/60.  The pulse was 90 and regular.  The  weight was 160 pounds.  Temperature was 98.  HEENT:  Normocephalic, atraumatic.  Pupils equal, round and reactive.  Oropharynx was moist.  NECK:  No obvious jugular venous distention.  The carotids were 2+ and  symmetric.  CARDIOVASCULAR:  Regular rate and rhythm with normal S1 and S2.  There is a  soft S4.  LUNGS:  Clear bilaterally to auscultation.  ABDOMEN:  Obese, nontender, nondistended.  EXTREMITIES:  Demonstrated no edema.   EKG:  Demonstrates normal sinus rhythm with right bundle branch block and  left anterior fascicular block.   IMPRESSION:  1. Recurrent unexplained syncope.  2. History of subdural hematoma status post evacuation with residual     encephalopathy.  3. Brief paroxysmal atrial fibrillation.  4. Bifascicular block.   DISCUSSION:  The etiology of the patient's syncope is unclear.  It most  likely is neuromediated although she could have worsening heart block.  She  has had tachy/brady though we have not see any of those things here in the  hospital.  Final recommendations will depend on how she progresses.  If her  neurologic status returns to normal and near normal then we would certainly  plan outpatient tilt table test and possible  Implantable loop recording  implantation secondary to recurrent episodes of syncope.                                               Doylene Canning. Ladona Ridgel, M.D. [facility]    GWT/MEDQ  D:  03/29/2002  T:  04/03/2002  Job:  [id number]   cc:   [facility]   Kathrine Cords, M.D.   Trudie Reed, M.D.

## 2012-12-13 ENCOUNTER — Other Ambulatory Visit (HOSPITAL_COMMUNITY): Payer: Self-pay

## 2012-12-13 DIAGNOSIS — R0602 Shortness of breath: Secondary | ICD-10-CM

## 2012-12-22 ENCOUNTER — Ambulatory Visit (HOSPITAL_COMMUNITY)
Admission: RE | Admit: 2012-12-22 | Discharge: 2012-12-22 | Disposition: A | Discharge status: Home / Self Care | Payer: Medicare Other | Source: Ambulatory Visit | Attending: Internal Medicine | Admitting: Internal Medicine

## 2012-12-22 DIAGNOSIS — R0602 Shortness of breath: Secondary | ICD-10-CM | POA: Insufficient documentation

## 2012-12-22 MED ORDER — ALBUTEROL SULFATE (5 MG/ML) 0.5% IN NEBU
2.5000 mg | INHALATION_SOLUTION | Freq: Once | RESPIRATORY_TRACT | Status: AC
  Administered 2012-12-22: 2.5 mg via RESPIRATORY_TRACT

## 2012-12-28 NOTE — Procedures (Signed)
Crystal Cisneros, Crystal Cisneros                 ACCOUNT NO.:  [id number]  MEDICAL RECORD NO.:  [id number]  LOCATION:  RESP                          FACILITY:  [facility]  PHYSICIAN:  Lasean Rahming L. Juanetta Gosling, M.D.DATE OF BIRTH:  1925/07/16  DATE OF PROCEDURE: DATE OF DISCHARGE:  12/22/2012                           PULMONARY FUNCTION TEST   REASON FOR PULMONARY FUNCTION TESTING:  Shortness of breath.  1. Spirometry shows a mild-to-moderate ventilatory defect with airflow     obstruction, most marked at the level of the smaller airway. 2. Lung volume showed reduction of the lung capacity at approximately     the same degree at the ventricle defect suggesting a primary     restrictive change. 3. DLCO is severely reduced, but improved somewhat when ventilation is     taken into account. 4. Airway resistance is normal. 5. There is improvement with inhaled bronchodilators but has not     reached the level of significance. 6. This study is consistent with restrictive lung disease.     Jessee Newnam L. Juanetta Gosling, M.D.     ELH/MEDQ  D:  12/27/2012  T:  12/28/2012  Job:  [id number]

## 2012-12-29 LAB — PULMONARY FUNCTION TEST

## 2013-05-02 ENCOUNTER — Other Ambulatory Visit: Payer: Self-pay

## 2013-05-03 ENCOUNTER — Ambulatory Visit (HOSPITAL_COMMUNITY)
Admission: RE | Admit: 2013-05-03 | Discharge: 2013-05-03 | Disposition: A | Discharge status: Home / Self Care | Payer: Medicare Other | Source: Ambulatory Visit | Attending: *Deleted | Admitting: *Deleted

## 2013-05-03 ENCOUNTER — Other Ambulatory Visit (HOSPITAL_COMMUNITY): Payer: Self-pay | Admitting: *Deleted

## 2013-05-03 DIAGNOSIS — Z01818 Encounter for other preprocedural examination: Secondary | ICD-10-CM | POA: Insufficient documentation

## 2013-05-03 DIAGNOSIS — R0602 Shortness of breath: Secondary | ICD-10-CM

## 2013-05-04 ENCOUNTER — Other Ambulatory Visit (HOSPITAL_COMMUNITY): Payer: Medicare Other

## 2013-05-09 ENCOUNTER — Ambulatory Visit (HOSPITAL_COMMUNITY)
Admission: RE | Admit: 2013-05-09 | Discharge: 2013-05-09 | Disposition: A | Discharge status: Home / Self Care | Payer: Medicare Other | Source: Ambulatory Visit | Attending: Internal Medicine | Admitting: Internal Medicine

## 2013-05-09 ENCOUNTER — Other Ambulatory Visit (HOSPITAL_COMMUNITY): Payer: Medicare Other

## 2013-05-09 DIAGNOSIS — Z86711 Personal history of pulmonary embolism: Secondary | ICD-10-CM | POA: Insufficient documentation

## 2013-05-09 DIAGNOSIS — R0989 Other specified symptoms and signs involving the circulatory and respiratory systems: Secondary | ICD-10-CM

## 2013-05-09 DIAGNOSIS — I270 Primary pulmonary hypertension: Secondary | ICD-10-CM | POA: Insufficient documentation

## 2013-05-09 DIAGNOSIS — I369 Nonrheumatic tricuspid valve disorder, unspecified: Secondary | ICD-10-CM

## 2013-05-09 DIAGNOSIS — R0609 Other forms of dyspnea: Secondary | ICD-10-CM | POA: Insufficient documentation

## 2013-05-09 NOTE — Progress Notes (Signed)
*  PRELIMINARY RESULTS* Echocardiogram 2D Echocardiogram has been performed.  Crystal Cisneros 05/09/2013, 4:04 PM

## 2014-08-30 ENCOUNTER — Other Ambulatory Visit (HOSPITAL_COMMUNITY)
Admission: RE | Admit: 2014-08-30 | Discharge: 2014-08-30 | Disposition: A | Discharge status: Home / Self Care | Payer: Medicare Other | Source: Ambulatory Visit | Attending: Orthopaedic Surgery | Admitting: Orthopaedic Surgery

## 2014-08-30 DIAGNOSIS — M65332 Trigger finger, left middle finger: Secondary | ICD-10-CM | POA: Diagnosis not present

## 2014-08-30 DIAGNOSIS — M65342 Trigger finger, left ring finger: Secondary | ICD-10-CM | POA: Diagnosis not present

## 2014-09-11 DIAGNOSIS — E039 Hypothyroidism, unspecified: Secondary | ICD-10-CM | POA: Diagnosis not present

## 2014-09-11 DIAGNOSIS — R5382 Chronic fatigue, unspecified: Secondary | ICD-10-CM | POA: Diagnosis not present

## 2014-09-11 DIAGNOSIS — R5383 Other fatigue: Secondary | ICD-10-CM | POA: Diagnosis not present

## 2014-12-24 DIAGNOSIS — R5382 Chronic fatigue, unspecified: Secondary | ICD-10-CM | POA: Diagnosis not present

## 2014-12-24 DIAGNOSIS — R0602 Shortness of breath: Secondary | ICD-10-CM | POA: Diagnosis not present

## 2014-12-24 DIAGNOSIS — R5383 Other fatigue: Secondary | ICD-10-CM | POA: Diagnosis not present

## 2014-12-24 DIAGNOSIS — R002 Palpitations: Secondary | ICD-10-CM | POA: Diagnosis not present

## 2014-12-25 ENCOUNTER — Encounter: Payer: Self-pay | Admitting: *Deleted

## 2014-12-31 ENCOUNTER — Encounter: Payer: Self-pay | Admitting: *Deleted

## 2015-01-01 ENCOUNTER — Encounter (INDEPENDENT_AMBULATORY_CARE_PROVIDER_SITE_OTHER): Payer: Medicare Other

## 2015-01-01 ENCOUNTER — Encounter: Payer: Self-pay | Admitting: Cardiology

## 2015-01-01 ENCOUNTER — Ambulatory Visit (INDEPENDENT_AMBULATORY_CARE_PROVIDER_SITE_OTHER): Payer: Medicare Other | Admitting: Cardiology

## 2015-01-01 VITALS — BP 150/65 | HR 64 | Ht 64.0 in | Wt 135.4 lb

## 2015-01-01 DIAGNOSIS — I272 Other secondary pulmonary hypertension: Secondary | ICD-10-CM | POA: Diagnosis not present

## 2015-01-01 DIAGNOSIS — R0602 Shortness of breath: Secondary | ICD-10-CM

## 2015-01-01 DIAGNOSIS — R002 Palpitations: Secondary | ICD-10-CM

## 2015-01-01 DIAGNOSIS — IMO0002 Reserved for concepts with insufficient information to code with codable children: Secondary | ICD-10-CM

## 2015-01-01 DIAGNOSIS — Z86711 Personal history of pulmonary embolism: Secondary | ICD-10-CM

## 2015-01-01 NOTE — Progress Notes (Signed)
Cardiology Office Note  Date: 01/01/2015   ID: Crystal Cisneros, DOB [birth year], MRN [id number]  PCP: Catalina PizzaHALL, ZACH, MD  Primary Cardiologist: Nona DellSamuel Renaye Janicki, MD   Chief Complaint  Patient presents with  . Shortness of Breath  . Palpitations    History of Present Illness: Crystal Cisneros is an 81 y.o. female referred for cardiology consultation by Dr. Margo AyeHall. She is here with her husband today. He actually provides a lot of her history, but she does contribute on her own as well. She has been experiencing exertional shortness of breath and fatigue for several months to years. It seems to be worse when she "over does it." Also has a vague sense of palpitations at times when she has fatigue. She does not endorse any chest pain or leg swelling.  I reviewed her available records in detail. She has had previous cardiology assessment by Dr. Ladona Ridgelaylor and also Dr. Domingo SepBradsher. Chart is updated below. Prior ischemic testing in 2008 did not demonstrate any significant abnormalities by Myoview. Most recent echocardiogram from 2014 is documented below. Prior RVSP in 2008 was in the 40-50 mmHg range. She had a prior diagnosis of pulmonary embolus in 2003 and underwent placement of an IVC filter, unable to be anticoagulated presumably due to head injury that she also had that year. There is some discussion about her having had a brief episode of atrial fibrillation around that point, but she has not had any obvious documented persistent arrhythmias subsequently.  I reviewed her recent ECG, outlined below.   Past Medical History  Diagnosis Date  . Hypothyroidism   . History of syncope   . Paroxysmal atrial fibrillation     Brief episodes noted 2003 - Dr. Ladona Ridgelaylor  . History of pulmonary embolus (PE)     [place] filter 2003  . Pulmonary hypertension   . Pulmonary fibrosis     Mild interstitial fibrosis by chest CT 2008    Past Surgical History  Procedure Laterality Date  . Craniotomy  2003    Subdural  hematoma  . Cholecystectomy    . Total abdominal hysterectomy    . Appendectomy      Medications: Synthroid - do not have dose available  Allergies:  Sulfa antibiotics   Social History: The patient  reports that she has never smoked. She does not have any smokeless tobacco history on file. She reports that she does not drink alcohol or use illicit drugs.   Family History: The patient's family history includes Dementia in her mother; Heart attack (age of onset: 3877) in her father.   ROS:  Please see the history of present illness. Otherwise, complete review of systems is positive for NYHA class III dyspnea on exertion intermittently noted.  All other systems are reviewed and negative.   Physical Exam: VS:  BP 150/65 mmHg  Pulse 64  Ht 5\' 4"  (1.626 m)  Wt 135 lb 6.4 oz (61.417 kg)  BMI 23.23 kg/m2  SpO2 91%, BMI Body mass index is 23.23 kg/(m^2).  Wt Readings from Last 3 Encounters:  01/01/15 135 lb 6.4 oz (61.417 kg)  03/28/07 164 lb (74.39 kg)  03/21/07 164 lb (74.39 kg)     General: Well-groomed, elderly woman appears comfortable at rest. HEENT: Conjunctiva and lids normal, oropharynx clear. Neck: Supple, no elevated JVP or carotid bruits, no thyromegaly. Lungs: No wheezing or rhonchi, nonlabored breathing at rest. Cardiac: Regular rate and rhythm, no S3, soft systolic murmur, no pericardial rub. Abdomen: Soft, nontender, bowel sounds  present, no guarding or rebound. Extremities: No pitting edema, venous varicosities noted with some spider veins, distal pulses 2+. Skin: Warm and dry. Musculoskeletal: Mild kyphosis. Neuropsychiatric: Alert and oriented x3, affect grossly appropriate.   ECG:  Recent tracing from 12/24/2014 showed sinus rhythm with PACs, right bundle branch block, and left anterior fascicular block.   Other Studies Reviewed Today:  Echocardiogram 05/09/2013: Study Conclusions  - Left ventricle: The cavity size was normal. There was mild concentric  hypertrophy. Systolic function was normal. The estimated ejection fraction was in the range of 60% to 65%. Wall motion was normal; there were no regional wall motion abnormalities. Doppler parameters are consistent with abnormal left ventricular relaxation (grade 1 diastolic dysfunction). Doppler parameters are consistent with high ventricular filling pressure. - Mitral valve: Calcified annulus. Mildly thickened leaflets . Mild regurgitation. Two jets seen, somewhat difficult to quantify. - Left atrium: The atrium was mildly dilated. - Right ventricle: The cavity size was moderately dilated. Systolic function was normal. - Right atrium: The atrium was mildly dilated. Central venous pressure: 8mm Hg (est). - Atrial septum: No defect or patent foramen ovale was identified. - Tricuspid valve: Moderate regurgitation. - Pulmonary arteries: Systolic pressure was severely increased. PA peak pressure: 75mm Hg (S). - Pericardium, extracardiac: There was no pericardial effusion. Impressions:  - No prior study for comparison. Mild LVH with LVEF 60-65%, grade 1 diastolic dysfunction with increased filling pressures. Mild biatrial enlargement. MAC with thickened mitral valve and probable mild regurgitation. Moderate RV enlargement. Moderate tricuspid regurgitation with evidence of severe pulmonary hypertension, PASP 75 mmHg.   Assessment and Plan:  1. Fairly long-standing dyspnea and fatigue on exertion. This may well be multifactorial in this 81 year old woman, however prior history and testing indicates that this could be related to pulmonary hypertension and possibly interstitial lung disease. She did have PFTs done back in 2014 that demonstrated restrictive findings with mild to moderate ventilatory defect in the small airways. There was no substantial bronchodilator improvement. Chest CT into 2008 reported mild interstitial fibrotic findings. She has a  previous history of pulmonary embolus as well. Echocardiogram from 2014 indicated PASP of 75 mmHg with only mild mitral regurgitation and LVEF 60-65% associated with grade 1 diastolic dysfunction. We will obtain a follow-up echocardiogram, and discussed the results with her. Can then determine how aggressively to pursue things.  2. Vague sense of palpitations, sometimes associated with shortness of breath and fatigue. 48 hour Holter monitor will be obtained.  3. Hypothyroidism, on Synthroid, followed by Dr. Margo AyeHall.  Current medicines were reviewed with the patient today.   Orders Placed This Encounter  Procedures  . Holter monitor - 48 hour  . Echocardiogram    Disposition: Call with results and determine next step.   Signed, Jonelle SidleSamuel G. Roosvelt Churchwell, MD, [facility] Center[facility]ACC 01/01/2015 2:32 PM    [facility] [address] [place], [place], [place] [address] Phone: [phone]; Fax: [phone]

## 2015-01-01 NOTE — Patient Instructions (Signed)
Your physician has requested that you have an echocardiogram. Echocardiography is a painless test that uses sound waves to create images of your heart. It provides your doctor with information about the size and shape of your heart and how well your heart's chambers and valves are working. This procedure takes approximately one hour. There are no restrictions for this procedure.  Your physician has recommended that you wear a holter monitor FOR 48 HOURS. Holter monitors are medical devices that record the heart's electrical activity. Doctors most often use these monitors to diagnose arrhythmias. Arrhythmias are problems with the speed or rhythm of the heartbeat. The monitor is a small, portable device. You can wear one while you do your normal daily activities. This is usually used to diagnose what is causing palpitations/syncope (passing out).  Your physician recommends that you continue on your current medications as directed. Please refer to the Current Medication list given to you today.  WE WILL CALL YOU WITH RESULTS  Thank you for choosing [facility]!!

## 2015-01-10 ENCOUNTER — Other Ambulatory Visit: Payer: Self-pay

## 2015-01-10 ENCOUNTER — Ambulatory Visit (INDEPENDENT_AMBULATORY_CARE_PROVIDER_SITE_OTHER): Payer: Medicare Other

## 2015-01-10 DIAGNOSIS — R0602 Shortness of breath: Secondary | ICD-10-CM

## 2015-01-15 ENCOUNTER — Telehealth: Payer: Self-pay | Admitting: Cardiovascular Disease

## 2015-01-15 NOTE — Telephone Encounter (Signed)
Patient informed. 

## 2015-01-15 NOTE — Telephone Encounter (Signed)
-----   Message from Samuel G McDowell, MD sent at 01/15/2015  9:00 AM EDT ----- Reviewed report. LV systolic function is normal with mild diastolic dysfunction, there is moderate mitral regurgitation. More importantly there is significant tricuspid regurgitation with evidence of severe pulmonary hypertension - a finding similar to her last echocardiogram. This would certainly be consistent with her history of chronic shortness of breath. The question is however how aggressively to pursue this with additional testing. I can certainly see her back in the office to discuss things further. 

## 2015-01-15 NOTE — Telephone Encounter (Signed)
Asking for results of her monitor and echo

## 2015-01-15 NOTE — Telephone Encounter (Signed)
-----   Message from Jonelle SidleSamuel G McDowell, MD sent at 01/15/2015  9:00 AM EDT ----- Reviewed report. LV systolic function is normal with mild diastolic dysfunction, there is moderate mitral regurgitation. More importantly there is significant tricuspid regurgitation with evidence of severe pulmonary hypertension - a finding similar to her last echocardiogram. This would certainly be consistent with her history of chronic shortness of breath. The question is however how aggressively to pursue this with additional testing. I can certainly see her back in the office to discuss things further.

## 2015-02-04 ENCOUNTER — Encounter: Payer: Self-pay | Admitting: Cardiology

## 2015-02-04 ENCOUNTER — Ambulatory Visit (INDEPENDENT_AMBULATORY_CARE_PROVIDER_SITE_OTHER): Payer: Medicare Other | Admitting: Cardiology

## 2015-02-04 ENCOUNTER — Other Ambulatory Visit: Payer: Self-pay | Admitting: *Deleted

## 2015-02-04 VITALS — BP 102/70 | HR 62 | Ht 64.0 in | Wt 139.0 lb

## 2015-02-04 DIAGNOSIS — Z86711 Personal history of pulmonary embolism: Secondary | ICD-10-CM | POA: Diagnosis not present

## 2015-02-04 DIAGNOSIS — E039 Hypothyroidism, unspecified: Secondary | ICD-10-CM

## 2015-02-04 DIAGNOSIS — I272 Pulmonary hypertension, unspecified: Secondary | ICD-10-CM

## 2015-02-04 DIAGNOSIS — R0602 Shortness of breath: Secondary | ICD-10-CM

## 2015-02-04 DIAGNOSIS — I270 Primary pulmonary hypertension: Secondary | ICD-10-CM

## 2015-02-04 NOTE — Progress Notes (Signed)
Cardiology Office Note  Date: 02/04/2015   ID: Crystal Cisneros, DOB [birth year], MRN [id number]  PCP: Crystal Pizza, MD  Primary Cardiologist: Crystal Dell, MD   Chief Complaint  Patient presents with  . Follow-up testing    History of Present Illness: Crystal Cisneros is an 81 y.o. female seen in consultation recently in May. She was evaluated with history of fairly chronic but progressive shortness of breath, also intermittent palpitations. She is here today with her husband and daughter-in-law.  She was referred for a follow-up echocardiogram which is outlined below. She had mild diastolic dysfunction with moderate mitral regurgitation, also evidence of moderate to severe tricuspid regurgitation and severe pulmonary hypertension with PASP 80 mmHg. She has had prior documented pulmonary hypertension as well with question of restrictive lung disease, but no recent reassessment from a pulmonary perspective. She reportedly had mild interstitial fibrosis by chest CT imaging in 2008.  Today I discussed the results of her echocardiogram in detail. It is unlikely that the degree of mitral regurgitation is explaining her shortness of breath or the degree of pulmonary hypertension. I am concerned that she may have an underlying pulmonary process, perhaps progressive fibrotic disease, or potentially even chronic thromboembolic disease although she did have a Greenfield filter back in 2003 with prior history of pulmonary embolus. She does not have a personal history of tobacco use, however a history of prolonged secondary exposure.  Today we discussed further testing to help clarify the situation.    Past Medical History  Diagnosis Date  . Hypothyroidism   . History of syncope   . Paroxysmal atrial fibrillation     Brief episodes noted 2003 - Dr. Ladona Ridgel  . History of pulmonary embolus (PE)     Greenfield filter 2003  . Pulmonary hypertension   . Pulmonary fibrosis     Mild interstitial fibrosis  by chest CT 2008    Past Surgical History  Procedure Laterality Date  . Craniotomy  2003    Subdural hematoma  . Cholecystectomy    . Total abdominal hysterectomy    . Appendectomy      Current Outpatient Prescriptions  Medication Sig Dispense Refill  . levothyroxine (SYNTHROID, LEVOTHROID) 75 MCG tablet Take 75 mcg by mouth daily before breakfast.      No current facility-administered medications for this visit.    Allergies:  Sulfa antibiotics   Social History: The patient  reports that she has never smoked. She has never used smokeless tobacco. She reports that she does not drink alcohol or use illicit drugs.   ROS:  Please see the history of present illness. Otherwise, complete review of systems is positive for NYHA class III dyspnea. Reduced fluid intake per family report. All other systems are reviewed and negative.   Physical Exam: VS:  BP 102/70 mmHg  Pulse 62  Ht 5\' 4"  (1.626 m)  Wt 139 lb (63.05 kg)  BMI 23.85 kg/m2  SpO2 91%, BMI Body mass index is 23.85 kg/(m^2).  Wt Readings from Last 3 Encounters:  02/04/15 139 lb (63.05 kg)  01/01/15 135 lb 6.4 oz (61.417 kg)  03/28/07 164 lb (74.39 kg)     General: Well-groomed, elderly woman appears comfortable at rest. HEENT: Conjunctiva and lids normal, oropharynx clear. Neck: Supple, no elevated JVP or carotid bruits, no thyromegaly. Lungs: No wheezing or rhonchi, nonlabored breathing at rest. Cardiac: Regular rate and rhythm, no S3, soft systolic murmur, no pericardial rub. Abdomen: Soft, nontender, bowel sounds present, no guarding  or rebound. Extremities: No pitting edema, venous varicosities noted with some spider veins, distal pulses 2+. Skin: Warm and dry. Musculoskeletal: Mild kyphosis. Neuropsychiatric: Alert and oriented x3, affect grossly appropriate.   ECG: ECG is not ordered today.   Other Studies Reviewed Today:  Echocardiogram 01/10/2015: Study Conclusions  - Left ventricle: The cavity size  was normal. Wall thickness was normal. Systolic function was normal. The estimated ejection fraction was in the range of 60% to 65%. Wall motion was normal; there were no regional wall motion abnormalities. Doppler parameters are consistent with abnormal left ventricular relaxation (grade 1 diastolic dysfunction). Doppler parameters are consistent with high ventricular filling pressure. - Ventricular septum: Septal motion showed abnormal function and dyssynergy. These changes are consistent with intraventricular conduction delay. - Aortic valve: Mildly to moderately calcified annulus. Trileaflet; mildly thickened leaflets. - Mitral valve: Severely thickened annulus. Mildly thickened leaflets . There was moderate regurgitation. Two distinct jets are noted. - Left atrium: The atrium was moderately dilated. - Right ventricle: The cavity size was mildly dilated. Systolic function was mildly reduced. - Right atrium: The atrium was moderately dilated. - Tricuspid valve: There was moderate-severe regurgitation. - Pulmonic valve: There was mild regurgitation. Severely elevated pulmonary pressures (80 mmHg). - Inferior vena cava: The vessel was dilated. The respirophasic diameter changes were blunted (< 50%), consistent with elevated central venous pressure.   Assessment and Plan:  1. Severe pulmonary hypertension with progressive dyspnea on exertion. Unlikely that degree of mitral regurgitation is necessarily leading to this. As noted above, plan will be to proceed with further evaluation including PFTs, 6 minute walk test, and a CT scan of the chest with and without contrast. It does not look like this is a new diagnosis based on review of her records, although she has had progression in symptoms.  2. Hypothyroidism, on Synthroid.  3. History of pulmonary embolus status post Greenfield filter back in 2003.  Current medicines were reviewed with the patient  today.   Orders Placed This Encounter  Procedures  . CT Chest W Wo Contrast  . Basic metabolic panel  . Pulmonary Function Test  . 6 minute walk    Disposition: Call with test results and arrange follow-up from there.Leonides Schanz, Jonelle Sidle, MD, [facility] 02/04/2015 10:34 AM    [facility] [address] [place], [place], [place] [address] Phone: [phone]; Fax: [phone]

## 2015-02-04 NOTE — Patient Instructions (Signed)
Your physician recommends that you continue on your current medications as directed. Please refer to the Current Medication list given to you today. Your physician recommends that you have lab work today to check your BMET. Non-Cardiac CT scanning of your chest, (CAT scanning), is a noninvasive, special x-ray that produces cross-sectional images of the body using x-rays and a computer. CT scans help physicians diagnose and treat medical conditions. For some CT exams, a contrast material is used to enhance visibility in the area of the body being studied. CT scans provide greater clarity and reveal more details than regular x-ray exams. Your physician has recommended that you have a pulmonary function test. Pulmonary Function Tests are a group of tests that measure how well air moves in and out of your lungs. You physician has recommended that you have a six minute walk test.

## 2015-02-05 ENCOUNTER — Ambulatory Visit: Payer: Self-pay | Admitting: Cardiology

## 2015-02-06 DIAGNOSIS — I270 Primary pulmonary hypertension: Secondary | ICD-10-CM | POA: Diagnosis not present

## 2015-02-06 DIAGNOSIS — R0602 Shortness of breath: Secondary | ICD-10-CM | POA: Diagnosis not present

## 2015-02-07 ENCOUNTER — Telehealth: Payer: Self-pay | Admitting: *Deleted

## 2015-02-07 NOTE — Telephone Encounter (Signed)
Patient informed. 

## 2015-02-07 NOTE — Telephone Encounter (Signed)
-----   Message from Jonelle Sidle, MD sent at 02/07/2015  9:14 AM EDT ----- Patient for chest CTA. Creatinine normal.

## 2015-02-08 ENCOUNTER — Ambulatory Visit: Payer: Self-pay

## 2015-02-08 ENCOUNTER — Ambulatory Visit (HOSPITAL_COMMUNITY)
Admission: RE | Admit: 2015-02-08 | Discharge: 2015-02-08 | Disposition: A | Discharge status: Home / Self Care | Payer: Medicare Other | Source: Ambulatory Visit | Attending: Cardiology | Admitting: Cardiology

## 2015-02-08 DIAGNOSIS — I272 Pulmonary hypertension, unspecified: Secondary | ICD-10-CM

## 2015-02-08 DIAGNOSIS — I270 Primary pulmonary hypertension: Secondary | ICD-10-CM | POA: Diagnosis not present

## 2015-02-08 DIAGNOSIS — R0602 Shortness of breath: Secondary | ICD-10-CM | POA: Diagnosis not present

## 2015-02-08 LAB — PULMONARY FUNCTION TEST
DL/VA % pred: 55 %
DL/VA: 2.68 ml/min/mmHg/L
DLCO unc % pred: 25 %
DLCO unc: 6.25 ml/min/mmHg
FEF 25-75 PRE: 0.93 L/s
FEF 25-75 Post: 1.64 L/sec
FEF2575-%CHANGE-POST: 76 %
FEF2575-%PRED-POST: 168 %
FEF2575-%Pred-Pre: 95 %
FEV1-%CHANGE-POST: 15 %
FEV1-%PRED-POST: 73 %
FEV1-%PRED-PRE: 64 %
FEV1-POST: 1.21 L
FEV1-PRE: 1.05 L
FEV1FVC-%CHANGE-POST: 5 %
FEV1FVC-%PRED-PRE: 108 %
FEV6-%Change-Post: 9 %
FEV6-%Pred-Post: 70 %
FEV6-%Pred-Pre: 64 %
FEV6-POST: 1.48 L
FEV6-Pre: 1.35 L
FEV6FVC-%PRED-PRE: 106 %
FEV6FVC-%Pred-Post: 106 %
FVC-%CHANGE-POST: 9 %
FVC-%PRED-POST: 66 %
FVC-%Pred-Pre: 60 %
FVC-Post: 1.48 L
FVC-Pre: 1.35 L
POST FEV1/FVC RATIO: 82 %
PRE FEV1/FVC RATIO: 78 %
PRE FEV6/FVC RATIO: 100 %
Post FEV6/FVC ratio: 100 %
RV % PRED: 61 %
RV: 1.58 L
TLC % PRED: 60 %
TLC: 3.04 L

## 2015-02-08 MED ORDER — ALBUTEROL SULFATE (2.5 MG/3ML) 0.083% IN NEBU
2.5000 mg | INHALATION_SOLUTION | Freq: Once | RESPIRATORY_TRACT | Status: AC
  Administered 2015-02-08: 2.5 mg via RESPIRATORY_TRACT

## 2015-02-11 DIAGNOSIS — H1045 Other chronic allergic conjunctivitis: Secondary | ICD-10-CM | POA: Diagnosis not present

## 2015-02-11 DIAGNOSIS — R0602 Shortness of breath: Secondary | ICD-10-CM | POA: Diagnosis not present

## 2015-02-12 ENCOUNTER — Telehealth: Payer: Self-pay | Admitting: Cardiology

## 2015-02-12 NOTE — Telephone Encounter (Signed)
Per [facility] , patient's shouldn't eat 2 hours prior to ct w/contrast to prevent n/v during test.  Patient informed.

## 2015-02-12 NOTE — Telephone Encounter (Signed)
Contact patient with instructions on her CT  That is scheduled At 8:30 tomorrow

## 2015-02-12 NOTE — Telephone Encounter (Signed)
Patient's husband advised that there is no prep for a ct chest other than getting the lab work.

## 2015-02-13 ENCOUNTER — Ambulatory Visit (HOSPITAL_COMMUNITY)
Admission: RE | Admit: 2015-02-13 | Discharge: 2015-02-13 | Disposition: A | Discharge status: Home / Self Care | Payer: Medicare Other | Source: Ambulatory Visit | Attending: Cardiology | Admitting: Cardiology

## 2015-02-13 DIAGNOSIS — I272 Pulmonary hypertension, unspecified: Secondary | ICD-10-CM

## 2015-02-13 DIAGNOSIS — J984 Other disorders of lung: Secondary | ICD-10-CM | POA: Diagnosis not present

## 2015-02-13 DIAGNOSIS — I270 Primary pulmonary hypertension: Secondary | ICD-10-CM | POA: Insufficient documentation

## 2015-02-13 DIAGNOSIS — J929 Pleural plaque without asbestos: Secondary | ICD-10-CM | POA: Diagnosis not present

## 2015-02-13 DIAGNOSIS — R0602 Shortness of breath: Secondary | ICD-10-CM | POA: Diagnosis not present

## 2015-02-13 LAB — POCT I-STAT CREATININE: CREATININE: 0.8 mg/dL (ref 0.44–1.00)

## 2015-02-13 MED ORDER — IOHEXOL 350 MG/ML SOLN
100.0000 mL | Freq: Once | INTRAVENOUS | Status: AC | PRN
  Administered 2015-02-13: 100 mL via INTRAVENOUS

## 2015-02-14 ENCOUNTER — Telehealth: Payer: Self-pay | Admitting: *Deleted

## 2015-02-14 NOTE — Telephone Encounter (Signed)
-----   Message from Jodelle Gross, NP sent at 02/12/2015  5:12 PM EDT ----- Please refer patient to pulmonology.She has an abnormal PFT result with"sever interstitial restriction and severe derfusion defect. Please have her see Dr. Diona Browner soon. He may want to schedule a right heart cath for this patient.   Conclusions: The reduced lung volumes, increased FEV1/FVC ratio and diffusion defect suggest an interstitial process such as fibrosis or interstitial inflammation. Anemia cannot be excluded as a potential cause of the diffusion defect without correcting the observed diffusing capacity for hemoglobin. In view of the severity of the diffusion defect, studies with exercise would be helpful to evaluate the presence of hypoxemia. Pulmonary Function Diagnosis: Severe Restriction -Interstitial Severe Diffusion Defect

## 2015-02-15 NOTE — Telephone Encounter (Signed)
-----   Message from Samuel G McDowell, MD sent at 02/14/2015  6:20 PM EDT ----- Reviewed report. No pulmonary embolus, evidence of chronic interstitial lung disease. With abnormal PFTs and documented severe pulmonary hypertension, could refer to [facility] for further evaluation if she would like to pursue further (at age 81). The lymph nodes described are nonspecific - would copy to PCP Dr. Hall in case he would like to assess further with additional testing. 

## 2015-02-18 NOTE — Telephone Encounter (Signed)
Message from [facility]. Please call Mrs. Fosnaugh around 11am on Tuesday.

## 2015-02-18 NOTE — Telephone Encounter (Signed)
-----   Message from Jonelle SidleSamuel G McDowell, MD sent at 02/14/2015  6:20 PM EDT ----- Reviewed report. No pulmonary embolus, evidence of chronic interstitial lung disease. With abnormal PFTs and documented severe pulmonary hypertension, could refer to [facility] for further evaluation if she would like to pursue further (at age 81). The lymph nodes described are nonspecific - would copy to PCP Dr. Margo AyeHall in case he would like to assess further with additional testing.

## 2015-02-19 NOTE — Telephone Encounter (Signed)
Mr. Crystal Cisneros walked into office today wanting results of testing. Read verbal results from Dr. Diona BrownerMcDowell and pt and Mr. Crystal Cisneros declined further referral to pulmonary. Routed results to Dr. Margo AyeHall pt has appt for next week and will discuss with him.

## 2015-03-11 DIAGNOSIS — R0602 Shortness of breath: Secondary | ICD-10-CM | POA: Diagnosis not present

## 2015-05-09 DIAGNOSIS — S0501XA Injury of conjunctiva and corneal abrasion without foreign body, right eye, initial encounter: Secondary | ICD-10-CM | POA: Diagnosis not present

## 2015-05-11 DIAGNOSIS — S0501XD Injury of conjunctiva and corneal abrasion without foreign body, right eye, subsequent encounter: Secondary | ICD-10-CM | POA: Diagnosis not present

## 2015-05-13 DIAGNOSIS — S0501XD Injury of conjunctiva and corneal abrasion without foreign body, right eye, subsequent encounter: Secondary | ICD-10-CM | POA: Diagnosis not present

## 2015-05-15 DIAGNOSIS — H1850 Unspecified hereditary corneal dystrophies: Secondary | ICD-10-CM | POA: Diagnosis not present

## 2015-09-16 DIAGNOSIS — E039 Hypothyroidism, unspecified: Secondary | ICD-10-CM | POA: Diagnosis not present

## 2015-10-14 DIAGNOSIS — R070 Pain in throat: Secondary | ICD-10-CM | POA: Diagnosis not present

## 2015-10-14 DIAGNOSIS — R05 Cough: Secondary | ICD-10-CM | POA: Diagnosis not present

## 2015-10-17 DIAGNOSIS — E039 Hypothyroidism, unspecified: Secondary | ICD-10-CM | POA: Diagnosis not present

## 2015-10-17 DIAGNOSIS — J00 Acute nasopharyngitis [common cold]: Secondary | ICD-10-CM | POA: Diagnosis not present

## 2015-10-17 DIAGNOSIS — R269 Unspecified abnormalities of gait and mobility: Secondary | ICD-10-CM | POA: Diagnosis not present

## 2015-10-22 DIAGNOSIS — J00 Acute nasopharyngitis [common cold]: Secondary | ICD-10-CM | POA: Diagnosis not present

## 2015-10-22 DIAGNOSIS — E039 Hypothyroidism, unspecified: Secondary | ICD-10-CM | POA: Diagnosis not present

## 2015-10-22 DIAGNOSIS — R269 Unspecified abnormalities of gait and mobility: Secondary | ICD-10-CM | POA: Diagnosis not present

## 2015-10-24 DIAGNOSIS — J00 Acute nasopharyngitis [common cold]: Secondary | ICD-10-CM | POA: Diagnosis not present

## 2015-10-24 DIAGNOSIS — R269 Unspecified abnormalities of gait and mobility: Secondary | ICD-10-CM | POA: Diagnosis not present

## 2015-10-24 DIAGNOSIS — E039 Hypothyroidism, unspecified: Secondary | ICD-10-CM | POA: Diagnosis not present

## 2015-10-29 DIAGNOSIS — J00 Acute nasopharyngitis [common cold]: Secondary | ICD-10-CM | POA: Diagnosis not present

## 2015-10-29 DIAGNOSIS — E039 Hypothyroidism, unspecified: Secondary | ICD-10-CM | POA: Diagnosis not present

## 2015-10-29 DIAGNOSIS — R269 Unspecified abnormalities of gait and mobility: Secondary | ICD-10-CM | POA: Diagnosis not present

## 2015-10-31 DIAGNOSIS — R269 Unspecified abnormalities of gait and mobility: Secondary | ICD-10-CM | POA: Diagnosis not present

## 2015-10-31 DIAGNOSIS — J00 Acute nasopharyngitis [common cold]: Secondary | ICD-10-CM | POA: Diagnosis not present

## 2015-10-31 DIAGNOSIS — E039 Hypothyroidism, unspecified: Secondary | ICD-10-CM | POA: Diagnosis not present

## 2015-11-19 DIAGNOSIS — E039 Hypothyroidism, unspecified: Secondary | ICD-10-CM | POA: Diagnosis not present

## 2015-11-19 DIAGNOSIS — R5382 Chronic fatigue, unspecified: Secondary | ICD-10-CM | POA: Diagnosis not present

## 2016-01-03 DIAGNOSIS — E46 Unspecified protein-calorie malnutrition: Secondary | ICD-10-CM | POA: Diagnosis not present

## 2016-01-03 DIAGNOSIS — E440 Moderate protein-calorie malnutrition: Secondary | ICD-10-CM | POA: Diagnosis not present

## 2016-01-03 DIAGNOSIS — R5382 Chronic fatigue, unspecified: Secondary | ICD-10-CM | POA: Diagnosis not present

## 2016-01-03 DIAGNOSIS — E039 Hypothyroidism, unspecified: Secondary | ICD-10-CM | POA: Diagnosis not present

## 2016-01-16 DIAGNOSIS — J8410 Pulmonary fibrosis, unspecified: Secondary | ICD-10-CM | POA: Diagnosis not present

## 2016-01-16 DIAGNOSIS — J9611 Chronic respiratory failure with hypoxia: Secondary | ICD-10-CM | POA: Diagnosis not present

## 2016-01-22 DIAGNOSIS — R05 Cough: Secondary | ICD-10-CM | POA: Diagnosis not present

## 2016-02-21 DIAGNOSIS — R05 Cough: Secondary | ICD-10-CM | POA: Diagnosis not present

## 2016-03-13 ENCOUNTER — Ambulatory Visit (INDEPENDENT_AMBULATORY_CARE_PROVIDER_SITE_OTHER): Payer: Medicare Other | Admitting: Pediatrics

## 2016-03-13 ENCOUNTER — Emergency Department (HOSPITAL_COMMUNITY): Payer: Medicare Other

## 2016-03-13 ENCOUNTER — Encounter (HOSPITAL_COMMUNITY): Payer: Self-pay | Admitting: *Deleted

## 2016-03-13 ENCOUNTER — Encounter: Payer: Self-pay | Admitting: Pediatrics

## 2016-03-13 ENCOUNTER — Emergency Department (HOSPITAL_COMMUNITY)
Admission: EM | Admit: 2016-03-13 | Discharge: 2016-03-13 | Disposition: A | Discharge status: Home / Self Care | Payer: Medicare Other | Attending: Emergency Medicine | Admitting: Emergency Medicine

## 2016-03-13 VITALS — BP 145/77 | HR 67 | Temp 97.9°F | Ht 64.0 in | Wt 115.6 lb

## 2016-03-13 DIAGNOSIS — G459 Transient cerebral ischemic attack, unspecified: Secondary | ICD-10-CM

## 2016-03-13 DIAGNOSIS — E039 Hypothyroidism, unspecified: Secondary | ICD-10-CM | POA: Insufficient documentation

## 2016-03-13 DIAGNOSIS — R200 Anesthesia of skin: Secondary | ICD-10-CM

## 2016-03-13 DIAGNOSIS — R29818 Other symptoms and signs involving the nervous system: Secondary | ICD-10-CM | POA: Diagnosis not present

## 2016-03-13 DIAGNOSIS — R414 Neurologic neglect syndrome: Secondary | ICD-10-CM

## 2016-03-13 DIAGNOSIS — I480 Paroxysmal atrial fibrillation: Secondary | ICD-10-CM | POA: Diagnosis not present

## 2016-03-13 DIAGNOSIS — Z7982 Long term (current) use of aspirin: Secondary | ICD-10-CM | POA: Diagnosis not present

## 2016-03-13 LAB — COMPREHENSIVE METABOLIC PANEL
ALBUMIN: 3.8 g/dL (ref 3.5–5.0)
ALK PHOS: 61 U/L (ref 38–126)
ALT: 15 U/L (ref 14–54)
AST: 22 U/L (ref 15–41)
Anion gap: 6 (ref 5–15)
BILIRUBIN TOTAL: 0.7 mg/dL (ref 0.3–1.2)
BUN: 17 mg/dL (ref 6–20)
CO2: 27 mmol/L (ref 22–32)
Calcium: 9.6 mg/dL (ref 8.9–10.3)
Chloride: 105 mmol/L (ref 101–111)
Creatinine, Ser: 0.64 mg/dL (ref 0.44–1.00)
GFR calc Af Amer: >60 mL/min (ref >60)
GFR calc non Af Amer: >60 mL/min (ref >60)
GLUCOSE: 87 mg/dL (ref 65–99)
POTASSIUM: 4.4 mmol/L (ref 3.5–5.1)
SODIUM: 138 mmol/L (ref 135–145)
TOTAL PROTEIN: 8.2 g/dL — AB (ref 6.5–8.1)

## 2016-03-13 LAB — CBC WITH DIFFERENTIAL/PLATELET
BASOS ABS: 0 10*3/uL (ref 0.0–0.1)
BASOS PCT: 1 %
EOS ABS: 0.2 10*3/uL (ref 0.0–0.7)
Eosinophils Relative: 3 %
HEMATOCRIT: 40.7 % (ref 36.0–46.0)
HEMOGLOBIN: 13.8 g/dL (ref 12.0–15.0)
Lymphocytes Relative: 54 %
Lymphs Abs: 3.6 10*3/uL (ref 0.7–4.0)
MCH: 32.2 pg (ref 26.0–34.0)
MCHC: 33.9 g/dL (ref 30.0–36.0)
MCV: 94.9 fL (ref 78.0–100.0)
Monocytes Absolute: 0.7 10*3/uL (ref 0.1–1.0)
Monocytes Relative: 11 %
NEUTROS ABS: 2 10*3/uL (ref 1.7–7.7)
NEUTROS PCT: 31 %
Platelets: 249 10*3/uL (ref 150–400)
RBC: 4.29 MIL/uL (ref 3.87–5.11)
RDW: 14.9 % (ref 11.5–15.5)
WBC: 6.5 10*3/uL (ref 4.0–10.5)

## 2016-03-13 NOTE — ED Notes (Signed)
Pt comes in for left arm numbness starting at 0730 today. Pt states she had the feeling of her arm "nothing belonging to her." Pt states she has full movement and no numbness to that arm now. Pt denies any other symptoms.

## 2016-03-13 NOTE — ED Provider Notes (Signed)
CSN: [id number]     Arrival date & time 03/13/16  1314 History   First MD Initiated Contact with Patient 03/13/16 1402     Chief Complaint  Patient presents with  . Numbness     (Consider location/radiation/quality/duration/timing/severity/associated sxs/prior Treatment) HPI...Marland Kitchen.Marland Kitchen.Episode of left hand numbness this morning for approximately one half hour, now completely improved. No weakness in the arms or legs, slurred speech, mental status changes, confusion, stiff neck. No prodromal illnesses. Patient is on prn oxygen at home for pulmonary fibrosis. She feels she is back to baseline.  Past Medical History  Diagnosis Date  . Hypothyroidism   . History of syncope   . Paroxysmal atrial fibrillation (HCC)     Brief episodes noted 2003 - Dr. Ladona Ridgelaylor  . History of pulmonary embolus (PE)     Greenfield filter 2003  . Pulmonary hypertension (HCC)   . Pulmonary fibrosis (HCC)     Mild interstitial fibrosis by chest CT 2008   Past Surgical History  Procedure Laterality Date  . Craniotomy  2003    Subdural hematoma  . Cholecystectomy    . Total abdominal hysterectomy    . Appendectomy     Family History  Problem Relation Age of Onset  . Heart attack Father [id number]  . Dementia Mother    Social History  Substance Use Topics  . Smoking status: Never Smoker   . Smokeless tobacco: Never Used  . Alcohol Use: No   OB History    No data available     Review of Systems  All other systems reviewed and are negative.     Allergies  Sulfa antibiotics  Home Medications   Prior to Admission medications   Medication Sig Start Date End Date Taking? Authorizing Provider  aspirin 81 MG tablet Take 81 mg by mouth daily.   Yes Historical Provider, MD  levothyroxine (SYNTHROID, LEVOTHROID) 75 MCG tablet Take 75 mcg by mouth daily before breakfast.  01/31/15  Yes Historical Provider, MD   BP 123/65 mmHg  Pulse 57  Temp(Src) 97.8 F (36.6 C) (Oral)  Resp 20  Ht 5\' 5"  (1.651 m)  Wt 115 lb  (52.164 kg)  BMI 19.14 kg/m2  SpO2 92% Physical Exam  Constitutional: She is oriented to person, place, and time. She appears well-developed and well-nourished.  HENT:  Head: Normocephalic and atraumatic.  Eyes: Conjunctivae are normal.  Neck: Neck supple.  Cardiovascular: Normal rate and regular rhythm.   Pulmonary/Chest: Effort normal and breath sounds normal.  Abdominal: Soft. Bowel sounds are normal.  Musculoskeletal: Normal range of motion.  Neurological: She is alert and oriented to person, place, and time.  Skin: Skin is warm and dry.  Psychiatric: She has a normal mood and affect. Her behavior is normal.  Nursing note and vitals reviewed.   ED Course  Procedures (including critical care time) Labs Review Labs Reviewed  COMPREHENSIVE METABOLIC PANEL - Abnormal; Notable for the following:    Total Protein 8.2 (*)    All other components within normal limits  CBC WITH DIFFERENTIAL/PLATELET    Imaging Review Ct Head Wo Contrast  03/13/2016  CLINICAL DATA:  Transient left arm numbness. Left arm numbness starting at 7:30 a.m. today. At this time, numbness has resolved. EXAM: CT HEAD WITHOUT CONTRAST TECHNIQUE: Contiguous axial images were obtained from the base of the skull through the vertex without intravenous contrast. COMPARISON:  Head CT dated 10/28/2004. FINDINGS: Brain: Again noted is generalized age-related parenchymal atrophy with commensurate dilatation of the ventricles and  sulci, slightly progressed in the interval. There is stable chronic encephalomalacia within the right frontal and temporal lobe regions. There is no mass, hemorrhage, edema or other evidence of acute parenchymal abnormality. No extra-axial hemorrhage. Vascular: No hyperdense vessel or unexpected calcification. There are chronic calcified atherosclerotic changes of the large vessels at the skull base. Skull: Negative for fracture or acute focal lesion. Stable surgical changes of right frontotemporal  craniotomy. Sinuses/Orbits: No acute findings. Other: None. IMPRESSION: 1. No acute intracranial abnormality. No intracranial mass, hemorrhage or edema. 2. Chronic findings detailed above. Electronically Signed   By: Bary Richard M.D.   On: 03/13/2016 15:55   I have personally reviewed and evaluated these images and lab results as part of my medical decision-making.   EKG Interpretation   Date/Time:  Friday March 13 2016 13:23:56 EDT Ventricular Rate:  60 PR Interval:    QRS Duration: 138 QT Interval:  446 QTC Calculation: 446 R Axis:   -91 Text Interpretation:  Sinus rhythm Atrial premature complexes RBBB and  LAFB Confirmed by Rashied Corallo  MD, Ajamu Maxon ([id number]) on 03/13/2016 2:03:53 PM      MDM   Final diagnoses:  Numbness of left hand    Patient reports she is normal now. Boyfriend confirms same. Screening tests including labs CT head, EKG shows no acute changes. Discussion of TIA possibility. Patient is on aspirin. She prefers to discharge home and follow-up with her primary care doctor.    Donnetta Hutching, MD 03/13/16 1750

## 2016-03-13 NOTE — Progress Notes (Signed)
    Subjective:    Patient ID: Crystal Cisneros, female    DOB: 1925-06-07, 82 y.o.   MRN: [id number]  CC: walk in   HPI: Crystal Cisneros is a 82 y.o. female presenting for walk in  Woke up this morning, felt fine Went to the bathroom, saw her L arm and didn't know what it was Tried to throw it off her lap Arm was completely numb, couldn't move it Husband took her SBP was 115, pulse normal Symptoms lasted about 30 minutes Now feeling weak, has been for a few weeks No other focal neurologic complaints No hx of strokes H/o DVTs, PEs Has pulm HTN from PEs Has IVC filter in place Takes a daily aspirin Not on anticoagulation because of brain bleed (?subdural from description) 14 yrs ago after a fall  Relevant past medical, surgical, family and social history reviewed and updated as indicated.  Interim medical history since our last visit reviewed. Allergies and medications reviewed and updated.  ROS: Per HPI   History  Smoking status  . Never Smoker   Smokeless tobacco  . Never Used       Objective:    BP 145/77 mmHg  Pulse 67  Temp(Src) 97.9 F (36.6 C) (Oral)  Ht 5\' 4"  (1.626 m)  Wt 115 lb 9.6 oz (52.436 kg)  BMI 19.83 kg/m2  SpO2 96%  Wt Readings from Last 3 Encounters:  03/13/16 115 lb 9.6 oz (52.436 kg)  02/04/15 139 lb (63.05 kg)  01/01/15 135 lb 6.4 oz (61.417 kg)     Gen: NAD, alert, cooperative with exam, NCAT EYES: EOMI, no scleral injection or icterus CV: NRRR, normal S1/S2, no murmur Resp: CTABL, no wheezes, normal WOB Ext: No edema, warm Neuro: Alert and oriented, CN III-XII intact, sensation intact b/l arms and legs, hand grip 5/5 b/l, flex/ext and knees 5/5 b/l, hip flex 5/5 b/l.     Assessment & Plan:    Crystal Cisneros was seen today for walk in with her husband, not recently seen in this practice, focal neurologic deficit with L sided neglect this morning that lasted for about 30 minutes, now improved though pt still feels weak. H/o pulm HTN,  hypothyroidism. Hypercoaguable, has IVC filter, not on anticoagulation. Neuro exam normal now, concern for TIA, sending to ED for TIA work up.   Diagnoses and all orders for this visit:  Transient cerebral ischemia, unspecified transient cerebral ischemia type  Focal neurological deficit  Left-sided neglect    Follow up plan: As needed  Rex Krasarol Dorthy Magnussen, MD [facility] 03/13/2016, 12:53 PM

## 2016-03-13 NOTE — Discharge Instructions (Signed)
Test showed no acute abnormalities. Follow-up with Dr. Juanetta GoslingHawkins on Tuesday. Return here if worse in any way.

## 2016-03-13 NOTE — ED Notes (Addendum)
Pt daughter doris called and inquired about updates. Pt verbalized permission to discuss care plan [facility]. Doris aware that testing is still being completed. Doris reported would call back later on for further updates.   Pt updated on plan of care. Pt reported would discuss care plan with any family member that called. Primary RN aware.

## 2016-03-17 DIAGNOSIS — J449 Chronic obstructive pulmonary disease, unspecified: Secondary | ICD-10-CM | POA: Diagnosis not present

## 2016-03-17 DIAGNOSIS — J9611 Chronic respiratory failure with hypoxia: Secondary | ICD-10-CM | POA: Diagnosis not present

## 2016-03-23 DIAGNOSIS — R05 Cough: Secondary | ICD-10-CM | POA: Diagnosis not present

## 2016-07-06 ENCOUNTER — Encounter: Payer: Self-pay | Admitting: Family

## 2016-07-06 ENCOUNTER — Ambulatory Visit (INDEPENDENT_AMBULATORY_CARE_PROVIDER_SITE_OTHER): Payer: Medicare Other | Admitting: Family

## 2016-07-06 VITALS — BP 157/83 | HR 64 | Temp 96.5°F | Ht 65.0 in | Wt 118.4 lb

## 2016-07-06 DIAGNOSIS — M545 Low back pain, unspecified: Secondary | ICD-10-CM

## 2016-07-06 DIAGNOSIS — N3000 Acute cystitis without hematuria: Secondary | ICD-10-CM

## 2016-07-06 LAB — URINALYSIS, COMPLETE
Bilirubin, UA: NEGATIVE
Glucose, UA: NEGATIVE
Ketones, UA: NEGATIVE
Nitrite, UA: NEGATIVE
PH UA: 6 (ref 5.0–7.5)
Protein, UA: NEGATIVE
RBC, UA: NEGATIVE
SPEC GRAV UA: 1.01 (ref 1.005–1.030)
Urobilinogen, Ur: 0.2 mg/dL (ref 0.2–1.0)

## 2016-07-06 LAB — MICROSCOPIC EXAMINATION

## 2016-07-06 MED ORDER — NITROFURANTOIN MONOHYD MACRO 100 MG PO CAPS: 100.0000 mg | ORAL_CAPSULE | Freq: Two times a day (BID) | ORAL | 0 refills | Status: DC

## 2016-07-06 NOTE — Progress Notes (Signed)
   Subjective:    Patient ID: Crystal Cisneros, female    DOB: Aug 12, 1925, 82 y.o.   MRN: [id number]  Back Pain  This is a new problem. The current episode started more than 1 month ago. The problem occurs constantly. The problem has been gradually worsening since onset. The pain is present in the thoracic spine (right flank). The pain does not radiate. The pain is at a severity of 10/10. The pain is moderate. The symptoms are aggravated by bending. Pertinent negatives include no bladder incontinence, bowel incontinence, dysuria, leg pain, perianal numbness, tingling or weakness. Risk factors include menopause. She has tried bed rest and NSAIDs for the symptoms. The treatment provided moderate relief.      Review of Systems  Gastrointestinal: Negative for bowel incontinence.  Genitourinary: Positive for flank pain. Negative for bladder incontinence, dysuria, frequency and vaginal bleeding.  Musculoskeletal: Positive for back pain.  Neurological: Negative for tingling and weakness.  All other systems reviewed and are negative.      Objective:   Physical Exam  Constitutional: She is oriented to person, place, and time. She appears well-developed and well-nourished. No distress.  HENT:  Head: Normocephalic.  Cardiovascular: Normal rate, regular rhythm, normal heart sounds and intact distal pulses.   No murmur heard. Pulmonary/Chest: Effort normal and breath sounds normal. No respiratory distress. She has no wheezes.  Abdominal: Soft. Bowel sounds are normal. She exhibits no distension. There is no tenderness.  Musculoskeletal: Normal range of motion. She exhibits no edema or tenderness.  Right CVA tenderness   Neurological: She is alert and oriented to person, place, and time.  Skin: Skin is warm and dry.  Psychiatric: She has a normal mood and affect. Her behavior is normal. Judgment and thought content normal.  Vitals reviewed.     BP (!) 151/82   Pulse 63   Temp (!) 96.5 F (35.8  C) (Oral)   Ht 5\' 5"  (1.651 m)   Wt 118 lb 6.4 oz (53.7 kg)   BMI 19.70 kg/m      Assessment & Plan:  1. Acute right-sided low back pain without sciatica - Urinalysis, Complete  2. Acute cystitis without hematuria Force fluids AZO over the counter X2 days RTO prn Culture pending - nitrofurantoin, macrocrystal-monohydrate, (MACROBID) 100 MG capsule; Take 1 capsule (100 mg total) by mouth 2 (two) times daily.  Dispense: 10 capsule; Refill: 0 - Urine culture  Jannifer Rodneyhristy Silviano Neuser, FNP

## 2016-07-06 NOTE — Patient Instructions (Signed)
Asymptomatic Bacteriuria, Female Asymptomatic bacteriuria is the presence of a large number of bacteria in your urine without the usual symptoms of burning or frequent urination. The following conditions increase the risk of asymptomatic bacteriuria:  Diabetes mellitus.  Advanced age.  Pregnancy in the first trimester.  Kidney stones.  Kidney transplants.  Leaky kidney tube valve in young children (reflux). Treatment for this condition is not needed in most people and can lead to other problems such as too much yeast and growth of resistant bacteria. However, some people, such as pregnant women, do need treatment to prevent kidney infection. Asymptomatic bacteriuria in pregnancy is also associated with fetal growth restriction, premature labor, and newborn death. HOME CARE INSTRUCTIONS Monitor your condition for any changes. The following actions may help to relieve any discomfort you are feeling:  Drink enough water and fluids to keep your urine clear or pale yellow. Go to the bathroom more often to keep your bladder empty.  Keep the area around your vagina and rectum clean. Wipe yourself from front to back after urinating. SEEK IMMEDIATE MEDICAL CARE IF:  You develop signs of an infection such as:  Burning with urination.  Frequency of voiding.  Back pain.  Fever.  You have blood in the urine.  You develop a fever. MAKE SURE YOU:  Understand these instructions.  Will watch your condition.  Will get help right away if you are not doing well or get worse.   This information is not intended to replace advice given to you by your health care provider. Make sure you discuss any questions you have with your health care provider.   Document Released: 08/10/2005 Document Revised: 08/31/2014 Document Reviewed: 01/30/2013 Elsevier Interactive Patient Education 2016 [employer]vier Inc.  

## 2016-07-07 LAB — URINE CULTURE

## 2016-07-08 ENCOUNTER — Telehealth: Payer: Self-pay | Admitting: Pediatrics

## 2016-07-08 NOTE — Telephone Encounter (Signed)
Spoke with pt regarding pain Informed pt to try Tylenol or Ibuprofen OTC Pt verbalizes understanding

## 2016-07-17 ENCOUNTER — Other Ambulatory Visit: Payer: Self-pay | Admitting: Family Medicine

## 2016-07-17 ENCOUNTER — Telehealth: Payer: Self-pay | Admitting: Pediatrics

## 2016-07-17 DIAGNOSIS — N3000 Acute cystitis without hematuria: Secondary | ICD-10-CM

## 2016-07-17 MED ORDER — NITROFURANTOIN MONOHYD MACRO 100 MG PO CAPS: 100.0000 mg | ORAL_CAPSULE | Freq: Two times a day (BID) | ORAL | 0 refills | Status: DC

## 2016-07-17 NOTE — Telephone Encounter (Signed)
Patient states that she still feels like she has UTI with frequent urination, burning with urination.

## 2016-08-24 DIAGNOSIS — S32000A Wedge compression fracture of unspecified lumbar vertebra, initial encounter for closed fracture: Secondary | ICD-10-CM

## 2016-08-24 HISTORY — DX: Wedge compression fracture of unspecified lumbar vertebra, initial encounter for closed fracture: S32.000A

## 2016-09-24 DIAGNOSIS — H1033 Unspecified acute conjunctivitis, bilateral: Secondary | ICD-10-CM | POA: Diagnosis not present

## 2016-10-01 DIAGNOSIS — H1033 Unspecified acute conjunctivitis, bilateral: Secondary | ICD-10-CM | POA: Diagnosis not present

## 2016-12-09 DIAGNOSIS — W1839XA Other fall on same level, initial encounter: Secondary | ICD-10-CM | POA: Diagnosis not present

## 2016-12-09 DIAGNOSIS — R040 Epistaxis: Secondary | ICD-10-CM | POA: Diagnosis not present

## 2016-12-09 DIAGNOSIS — R55 Syncope and collapse: Secondary | ICD-10-CM | POA: Diagnosis not present

## 2016-12-09 DIAGNOSIS — S022XXA Fracture of nasal bones, initial encounter for closed fracture: Secondary | ICD-10-CM | POA: Diagnosis not present

## 2016-12-10 DIAGNOSIS — R55 Syncope and collapse: Secondary | ICD-10-CM | POA: Diagnosis not present

## 2016-12-10 DIAGNOSIS — S299XXA Unspecified injury of thorax, initial encounter: Secondary | ICD-10-CM | POA: Diagnosis not present

## 2016-12-10 DIAGNOSIS — M545 Low back pain: Secondary | ICD-10-CM | POA: Diagnosis not present

## 2016-12-10 DIAGNOSIS — Z86711 Personal history of pulmonary embolism: Secondary | ICD-10-CM | POA: Diagnosis not present

## 2016-12-10 DIAGNOSIS — S0990XA Unspecified injury of head, initial encounter: Secondary | ICD-10-CM | POA: Diagnosis not present

## 2016-12-10 DIAGNOSIS — I4891 Unspecified atrial fibrillation: Secondary | ICD-10-CM | POA: Diagnosis not present

## 2016-12-10 DIAGNOSIS — R0902 Hypoxemia: Secondary | ICD-10-CM | POA: Diagnosis not present

## 2016-12-10 DIAGNOSIS — Z79899 Other long term (current) drug therapy: Secondary | ICD-10-CM | POA: Diagnosis not present

## 2016-12-10 DIAGNOSIS — J9601 Acute respiratory failure with hypoxia: Secondary | ICD-10-CM | POA: Diagnosis not present

## 2016-12-10 DIAGNOSIS — I452 Bifascicular block: Secondary | ICD-10-CM | POA: Diagnosis not present

## 2016-12-10 DIAGNOSIS — M25511 Pain in right shoulder: Secondary | ICD-10-CM | POA: Diagnosis not present

## 2016-12-10 DIAGNOSIS — R42 Dizziness and giddiness: Secondary | ICD-10-CM | POA: Diagnosis not present

## 2016-12-10 DIAGNOSIS — S0993XA Unspecified injury of face, initial encounter: Secondary | ICD-10-CM | POA: Diagnosis not present

## 2016-12-10 DIAGNOSIS — E039 Hypothyroidism, unspecified: Secondary | ICD-10-CM | POA: Diagnosis not present

## 2016-12-10 DIAGNOSIS — W1839XA Other fall on same level, initial encounter: Secondary | ICD-10-CM | POA: Diagnosis not present

## 2016-12-10 DIAGNOSIS — Z882 Allergy status to sulfonamides status: Secondary | ICD-10-CM | POA: Diagnosis not present

## 2016-12-10 DIAGNOSIS — S199XXA Unspecified injury of neck, initial encounter: Secondary | ICD-10-CM | POA: Diagnosis not present

## 2016-12-10 DIAGNOSIS — I4892 Unspecified atrial flutter: Secondary | ICD-10-CM | POA: Diagnosis not present

## 2016-12-10 DIAGNOSIS — Z9181 History of falling: Secondary | ICD-10-CM | POA: Diagnosis not present

## 2016-12-10 DIAGNOSIS — S022XXA Fracture of nasal bones, initial encounter for closed fracture: Secondary | ICD-10-CM

## 2016-12-10 DIAGNOSIS — S3992XA Unspecified injury of lower back, initial encounter: Secondary | ICD-10-CM | POA: Diagnosis not present

## 2016-12-10 HISTORY — DX: Fracture of nasal bones, initial encounter for closed fracture: S02.2XXA

## 2016-12-15 DIAGNOSIS — R55 Syncope and collapse: Secondary | ICD-10-CM | POA: Diagnosis not present

## 2016-12-15 DIAGNOSIS — Z9181 History of falling: Secondary | ICD-10-CM | POA: Diagnosis not present

## 2016-12-15 DIAGNOSIS — I4892 Unspecified atrial flutter: Secondary | ICD-10-CM | POA: Diagnosis not present

## 2016-12-15 DIAGNOSIS — M25511 Pain in right shoulder: Secondary | ICD-10-CM | POA: Diagnosis not present

## 2016-12-15 DIAGNOSIS — E039 Hypothyroidism, unspecified: Secondary | ICD-10-CM | POA: Diagnosis not present

## 2016-12-15 DIAGNOSIS — Z86711 Personal history of pulmonary embolism: Secondary | ICD-10-CM | POA: Diagnosis not present

## 2016-12-15 DIAGNOSIS — R42 Dizziness and giddiness: Secondary | ICD-10-CM | POA: Diagnosis not present

## 2016-12-15 DIAGNOSIS — S022XXD Fracture of nasal bones, subsequent encounter for fracture with routine healing: Secondary | ICD-10-CM | POA: Diagnosis not present

## 2016-12-17 DIAGNOSIS — R55 Syncope and collapse: Secondary | ICD-10-CM | POA: Diagnosis not present

## 2016-12-17 DIAGNOSIS — R42 Dizziness and giddiness: Secondary | ICD-10-CM | POA: Diagnosis not present

## 2016-12-17 DIAGNOSIS — Z86711 Personal history of pulmonary embolism: Secondary | ICD-10-CM | POA: Diagnosis not present

## 2016-12-17 DIAGNOSIS — E039 Hypothyroidism, unspecified: Secondary | ICD-10-CM | POA: Diagnosis not present

## 2016-12-17 DIAGNOSIS — S022XXD Fracture of nasal bones, subsequent encounter for fracture with routine healing: Secondary | ICD-10-CM | POA: Diagnosis not present

## 2016-12-17 DIAGNOSIS — M25511 Pain in right shoulder: Secondary | ICD-10-CM | POA: Diagnosis not present

## 2016-12-17 DIAGNOSIS — I4892 Unspecified atrial flutter: Secondary | ICD-10-CM | POA: Diagnosis not present

## 2016-12-17 DIAGNOSIS — Z9181 History of falling: Secondary | ICD-10-CM | POA: Diagnosis not present

## 2016-12-22 ENCOUNTER — Encounter: Payer: Self-pay | Admitting: *Deleted

## 2016-12-22 DIAGNOSIS — R55 Syncope and collapse: Secondary | ICD-10-CM | POA: Diagnosis not present

## 2016-12-22 DIAGNOSIS — S022XXD Fracture of nasal bones, subsequent encounter for fracture with routine healing: Secondary | ICD-10-CM | POA: Diagnosis not present

## 2016-12-22 DIAGNOSIS — M25511 Pain in right shoulder: Secondary | ICD-10-CM | POA: Diagnosis not present

## 2016-12-22 DIAGNOSIS — E039 Hypothyroidism, unspecified: Secondary | ICD-10-CM | POA: Diagnosis not present

## 2016-12-22 DIAGNOSIS — Z86711 Personal history of pulmonary embolism: Secondary | ICD-10-CM | POA: Diagnosis not present

## 2016-12-22 DIAGNOSIS — I4892 Unspecified atrial flutter: Secondary | ICD-10-CM | POA: Diagnosis not present

## 2016-12-22 DIAGNOSIS — R42 Dizziness and giddiness: Secondary | ICD-10-CM | POA: Diagnosis not present

## 2016-12-22 DIAGNOSIS — Z9181 History of falling: Secondary | ICD-10-CM | POA: Diagnosis not present

## 2016-12-23 ENCOUNTER — Encounter: Payer: Self-pay | Admitting: Adult Health

## 2016-12-23 ENCOUNTER — Encounter: Payer: Self-pay | Admitting: *Deleted

## 2016-12-23 NOTE — Progress Notes (Deleted)
Cardiology Office Note   Date:  12/23/2016   ID:  ARLYNE BRANDES, DOB 04-Jan-1925, MRN [id number]  PCP:  Johna Sheriff, MD  Cardiologist:  Diona Browner, MD  No chief complaint on file.     History of Present Illness: Crystal Cisneros is a 83 y.o. female who presents for ongoing assessment and management of severe pulmonary hypertension, with mitral regurgitation, hypothyroidism. She was to have PFTs for further assessment of chronic dyspnea. PFTs were abnormal with severe interstitial restriction and difusion defect. She was referred to pulmonary.   She was recently discharged from [facility] after admission for syncope and collapse. She sustained a nasal fracture. She had episode of transient atrial flutter per EKG.Echo revealed EF of 50-55%, with mild LVH, trace mild AoV stenosis, mild mitral regurgitation, and mild TR.   Past Medical History:  Diagnosis Date  . History of pulmonary embolus (PE)    Greenfield filter 2003  . History of syncope   . Hypothyroidism   . Paroxysmal atrial fibrillation (HCC)    Brief episodes noted 2003 - Dr. Ladona Ridgel  . Pulmonary fibrosis (HCC)    Mild interstitial fibrosis by chest CT 2008  . Pulmonary hypertension     Past Surgical History:  Procedure Laterality Date  . APPENDECTOMY    . CHOLECYSTECTOMY    . CRANIOTOMY  2003   Subdural hematoma  . TOTAL ABDOMINAL HYSTERECTOMY       Current Outpatient Prescriptions  Medication Sig Dispense Refill  . aspirin 81 MG tablet Take 81 mg by mouth daily.    Marland Kitchen levothyroxine (SYNTHROID, LEVOTHROID) 75 MCG tablet Take 75 mcg by mouth daily before breakfast.     . Naproxen Sodium 220 MG CAPS Take 440 mg by mouth daily.    . nitrofurantoin, macrocrystal-monohydrate, (MACROBID) 100 MG capsule Take 1 capsule (100 mg total) by mouth 2 (two) times daily. 10 capsule 0   No current facility-administered medications for this visit.     Allergies:   Sulfa antibiotics    Social History:  The patient   reports that she has never smoked. She has never used smokeless tobacco. She reports that she does not drink alcohol or use drugs.   Family History:  The patient's family history includes Dementia in her mother; Heart attack (age of onset: 16) in her father.    ROS: All other systems are reviewed and negative. Unless otherwise mentioned in H&P    PHYSICAL EXAM: VS:  There were no vitals taken for this visit. , BMI There is no height or weight on file to calculate BMI. GEN: Well nourished, well developed, in no acute distress  HEENT: normal  Neck: no JVD, carotid bruits, or masses Cardiac: ***RRR; no murmurs, rubs, or gallops,no edema  Respiratory:  clear to auscultation bilaterally, normal work of breathing GI: soft, nontender, nondistended, + BS MS: no deformity or atrophy  Skin: warm and dry, no rash Neuro:  Strength and sensation are intact Psych: euthymic mood, full affect   EKG:  EKG {ACTION; IS/IS [id number]} ordered today. The ekg ordered today demonstrates ***   Recent Labs: 03/13/2016: ALT 15; BUN 17; Creatinine, Ser 0.64; Hemoglobin 13.8; Platelets 249; Potassium 4.4; Sodium 138    Lipid Panel    Component Value Date/Time   CHOL 179 03/22/2007 0000   TRIG 115 03/22/2007 0000   HDL 42 03/22/2007 0000   CHOLHDL 4.3 Ratio 03/22/2007 0000   VLDL 23 03/22/2007 0000   LDLCALC 114 (H) 03/22/2007 0000  Wt Readings from Last 3 Encounters:  07/06/16 118 lb 6.4 oz (53.7 kg)  03/13/16 115 lb (52.2 kg)  03/13/16 115 lb 9.6 oz (52.4 kg)      Other studies Reviewed: Other Studies Reviewed Today:  Echocardiogram 01/10/2015: Study Conclusions  - Left ventricle: The cavity size was normal. Wall thickness was normal. Systolic function was normal. The estimated ejection fraction was in the range of 60% to 65%. Wall motion was normal; there were no regional wall motion abnormalities. Doppler parameters are consistent with abnormal left  ventricular relaxation (grade 1 diastolic dysfunction). Doppler parameters are consistent with high ventricular filling pressure. - Ventricular septum: Septal motion showed abnormal function and dyssynergy. These changes are consistent with intraventricular conduction delay. - Aortic valve: Mildly to moderately calcified annulus. Trileaflet; mildly thickened leaflets. - Mitral valve: Severely thickened annulus. Mildly thickened leaflets . There was moderate regurgitation. Two distinct jets are noted. - Left atrium: The atrium was moderately dilated. - Right ventricle: The cavity size was mildly dilated. Systolic function was mildly reduced. - Right atrium: The atrium was moderately dilated. - Tricuspid valve: There was moderate-severe regurgitation. - Pulmonic valve: There was mild regurgitation. Severely elevated pulmonary pressures (80 mmHg). - Inferior vena cava: The vessel was dilated. The respirophasic diameter changes were blunted (< 50%), consistent with elevated central venous pressure.  ASSESSMENT AND PLAN:  1.  ***   Current medicines are reviewed at length with the patient today.    Labs/ tests ordered today include: *** No orders of the defined types were placed in this encounter.    Disposition:   FU with   Signed, Joni Reining, NP  12/23/2016 7:58 AM    [facility] [address], [place], [place] [address] Phone: [phone]; Fax: [phone]

## 2016-12-25 ENCOUNTER — Ambulatory Visit (INDEPENDENT_AMBULATORY_CARE_PROVIDER_SITE_OTHER): Payer: Medicare Other | Admitting: Cardiology

## 2016-12-25 ENCOUNTER — Encounter: Payer: Self-pay | Admitting: Cardiology

## 2016-12-25 VITALS — BP 134/71 | HR 70 | Ht 64.0 in | Wt 120.0 lb

## 2016-12-25 DIAGNOSIS — Z8679 Personal history of other diseases of the circulatory system: Secondary | ICD-10-CM | POA: Diagnosis not present

## 2016-12-25 DIAGNOSIS — R42 Dizziness and giddiness: Secondary | ICD-10-CM | POA: Diagnosis not present

## 2016-12-25 DIAGNOSIS — M25511 Pain in right shoulder: Secondary | ICD-10-CM | POA: Diagnosis not present

## 2016-12-25 DIAGNOSIS — I272 Pulmonary hypertension, unspecified: Secondary | ICD-10-CM

## 2016-12-25 DIAGNOSIS — R296 Repeated falls: Secondary | ICD-10-CM

## 2016-12-25 DIAGNOSIS — R55 Syncope and collapse: Secondary | ICD-10-CM | POA: Diagnosis not present

## 2016-12-25 DIAGNOSIS — I4892 Unspecified atrial flutter: Secondary | ICD-10-CM | POA: Diagnosis not present

## 2016-12-25 DIAGNOSIS — S022XXD Fracture of nasal bones, subsequent encounter for fracture with routine healing: Secondary | ICD-10-CM | POA: Diagnosis not present

## 2016-12-25 DIAGNOSIS — E039 Hypothyroidism, unspecified: Secondary | ICD-10-CM

## 2016-12-25 DIAGNOSIS — Z9181 History of falling: Secondary | ICD-10-CM | POA: Diagnosis not present

## 2016-12-25 DIAGNOSIS — Z86711 Personal history of pulmonary embolism: Secondary | ICD-10-CM | POA: Diagnosis not present

## 2016-12-25 MED ORDER — FLUDROCORTISONE ACETATE 0.1 MG PO TABS: 0.1000 mg | ORAL_TABLET | Freq: Every day | ORAL | 6 refills | Status: DC

## 2016-12-25 NOTE — Addendum Note (Signed)
Addended by: Lesle ChrisHILL, Genowefa Morga G on: 12/25/2016 03:34 PM   Modules accepted: Orders

## 2016-12-25 NOTE — Progress Notes (Signed)
Cardiology Office Note  Date: 12/25/2016   ID: Crystal HeadMary L Catarino, DOB [birth year], MRN [id number]  PCP: Johna Sheriffarol L Vincent, MD  Primary Cardiologist: Nona DellSamuel McDowell, MD   Chief Complaint  Patient presents with  . Hospitalization Follow-up    History of Present Illness: Crystal Cisneros is a 83 y.o. female I have not seen since June 2016. It appears that she missed a recent scheduled visit with Ms. Lawrence NP following hospitalization at [facility] and was placed on my schedule today. I was not able to review her records until after she had left. In discussing the situation with the patient and her husband, she was recently hospitalized after at least 2 falls that occurred at home. Patient does not recall specific details as to whether she had syncope or tripped on a rug in her house, but she did sustain injuries including a nasal fracture. She does not recall any specific chest pain or palpitations.   On review of records there is mention of brief atrial fibrillation/flutter based presumably on heart monitoring at [facility], although not definitively associated with any symptoms. She has had this documented in the past, as well as PACs and PVCs. Echocardiography revealed LVEF 50-55% with mild LVH, mild aortic stenosis, mild mitral regurgitation, and mild tricuspid regurgitation. Head CT did not show any acute event. Lab work is outlined below.  Patient is wearing oxygen supplementation now per PCP. She has a history of dyspnea on exertion and severe pulmonary hypertension with probable interstitial lung disease based on previous workup. She declined further workup by Pulmonary in the past.  Husband states that he checks her blood pressure fairly regularly. Generally her systolic is somewhere between [phone]. Question whether any of her falls could be related to orthostasis although not sure that this was ever proven during her recent hospital stay.  Past Medical  History:  Diagnosis Date  . History of pulmonary embolus (PE)    Greenfield filter 2003  . History of syncope   . Hypothyroidism   . Paroxysmal atrial fibrillation (HCC)    Brief episodes noted 2003 - Dr. Ladona Ridgelaylor  . Pulmonary fibrosis (HCC)    Mild interstitial fibrosis by chest CT 2008  . Pulmonary hypertension (HCC)     Past Surgical History:  Procedure Laterality Date  . APPENDECTOMY    . CHOLECYSTECTOMY    . CRANIOTOMY  2003   Subdural hematoma  . TOTAL ABDOMINAL HYSTERECTOMY      Current Outpatient Prescriptions  Medication Sig Dispense Refill  . aspirin 81 MG tablet Take 81 mg by mouth daily as needed.     Marland Kitchen. ibuprofen (ADVIL,MOTRIN) 200 MG tablet Take 200 mg by mouth every 6 (six) hours as needed.    Marland Kitchen. levothyroxine (SYNTHROID, LEVOTHROID) 75 MCG tablet Take 75 mcg by mouth daily before breakfast.      No current facility-administered medications for this visit.    Allergies:  Sulfa antibiotics   Social History: The patient  reports that she has never smoked. She has never used smokeless tobacco. She reports that she does not drink alcohol or use drugs.   ROS:  Please see the history of present illness. Otherwise, complete review of systems is positive for hearing loss, chronic shortness of breath.  All other systems are reviewed and negative.   Physical Exam: VS:  BP 134/71   Pulse 70   Ht 5\' 4"  (1.626 m)   Wt 120 lb (54.4 kg)   SpO2  97% Comment: on 2L O2 via [place]  BMI 20.60 kg/m , BMI Body mass index is 20.6 kg/m.  Wt Readings from Last 3 Encounters:  12/25/16 120 lb (54.4 kg)  07/06/16 118 lb 6.4 oz (53.7 kg)  03/13/16 115 lb (52.2 kg)    General: Frail-appearing, elderly woman wearing oxygen via nasal cannula. HEENT: Conjunctiva and lids normal, oropharynx clear. Resolving perinasal ecchymoses. Neck: Supple, no elevated JVP or carotid bruits, no thyromegaly. Lungs: Coarse but clear breath sounds, nonlabored breathing at rest. Cardiac: Regular rate and  rhythm with ectopy, no S3, soft systolic murmur, no pericardial rub. Abdomen: Soft, nontender, bowel sounds present, no guarding or rebound. Extremities: No pitting edema, venous varicosities noted with some spider veins, distal pulses 2+. Skin: Warm and dry. Musculoskeletal: Mild kyphosis. Neuropsychiatric: Alert and oriented x3, affect grossly appropriate.  ECG: I personally reviewed the tracing from 03/13/2016 which showed sinus rhythm with PAC, right bundle branch block and left anterior fascicular block.  Recent Labwork: 03/13/2016: ALT 15; AST 22; BUN 17; Creatinine, Ser 0.64; Hemoglobin 13.8; Platelets 249; Potassium 4.4; Sodium 138     Component Value Date/Time   CHOL 179 03/22/2007 0000   TRIG 115 03/22/2007 0000   HDL 42 03/22/2007 0000   CHOLHDL 4.3 Ratio 03/22/2007 0000   VLDL 23 03/22/2007 0000   LDLCALC 114 (H) 03/22/2007 0000  April 2018: BUN 20, creatinine 0.6, AST 22, ALT 12, potassium 4.3, troponin T negative, NT-proBNP 5249, hemoglobin 13.3, platelets 165   Other Studies Reviewed Today:  Echocardiogram 01/10/2015: Study Conclusions  - Left ventricle: The cavity size was normal. Wall thickness was normal. Systolic function was normal. The estimated ejection fraction was in the range of 60% to 65%. Wall motion was normal; there were no regional wall motion abnormalities. Doppler parameters are consistent with abnormal left ventricular relaxation (grade 1 diastolic dysfunction). Doppler parameters are consistent with high ventricular filling pressure. - Ventricular septum: Septal motion showed abnormal function and dyssynergy. These changes are consistent with intraventricular conduction delay. - Aortic valve: Mildly to moderately calcified annulus. Trileaflet; mildly thickened leaflets. - Mitral valve: Severely thickened annulus. Mildly thickened leaflets . There was moderate regurgitation. Two distinct jets are noted. - Left atrium: The  atrium was moderately dilated. - Right ventricle: The cavity size was mildly dilated. Systolic function was mildly reduced. - Right atrium: The atrium was moderately dilated. - Tricuspid valve: There was moderate-severe regurgitation. - Pulmonic valve: There was mild regurgitation. Severely elevated pulmonary pressures (80 mmHg). - Inferior vena cava: The vessel was dilated. The respirophasic diameter changes were blunted (< 50%), consistent with elevated central venous pressure.  Chest CT 02/13/2015: IMPRESSION: 1. No evidence of acute pulmonary embolism. 2. Stable chronic pulmonary fibrosis. 3. Bilateral slightly prominent subpectoral lymph nodes which have not changed significantly since the prior CT of 06/20/2011 and are of questionable significance most likely reactive. Correlate with mammography.  Holter monitor 01/01/2015: Study sent to me for review on 01/08/2015. Sinus rhythm present with heart rate ranging 50-112 bpm. Frequent PVCs (approximately 1% total beats) and frequent PACs (approximately 1% total beats) noted. Rare ventricular couplets, and brief bursts of SVT noted. No significant pauses.  PFTs 02/08/2015: Conclusions: The reduced lung volumes, increased FEV1/FVC ratio and diffusion defect suggest an interstitial process such as fibrosis or interstitial inflammation. Anemia cannot be excluded as a potential cause of the diffusion defect without correcting the observed diffusing capacity for hemoglobin. In view of the severity of the diffusion defect, studies with exercise would  be helpful to evaluate the presence of hypoxemia. Pulmonary Function Diagnosis: Severe Restriction -Interstitial Severe Diffusion Defect  Assessment and Plan:  1. Recent falls as outlined above, etiology not entirely clear. It seems unlikely that brief transient atrial fibrillation/flutter is the culprit, as this has been documented in the past and not associated with symptoms in  particular. Importantly, records do not mention any bradycardia or pauses. LVEF also low-normal range and no major valvular abnormalities to explain symptoms. Could potentially be orthostatic hypotension, might consider using a volume expander such as Florinef empirically. If this is not effective, could consider further outpatient cardiac monitoring, mainly to exclude any associated symptomatic bradyarrhythmias that were perhaps not caught during her hospitalization.  2. Previously documented severe pulmonary hypertension with interstitial lung disease and abnormal PFTs. Patient declined further workup by Pulmonary. She is now on supplemental oxygen per PCP. This condition can sometimes also be associated with syncope.  3. History of transient atrial fibrillation. Patient is not a good candidate for anticoagulation particularly in light of high risk for frequent falls.  4. Hypothyroidism, continues on Synthroid.  Current medicines were reviewed with the patient today.  Disposition: Follow-up in 6 weeks.  Signed, Jonelle Sidle, MD, [facility] 12/25/2016 11:54 AM    [facility] at [facility] [address] [place], [place], [place] [address] Phone: [phone]; Fax: [phone]

## 2016-12-25 NOTE — Patient Instructions (Addendum)
Medication Instructions:  Continue all current medications.  Labwork: none  Testing/Procedures: none  Follow-Up: Will call after MD review records.    Any Other Special Instructions Will Be Listed Below (If Applicable).  If you need a refill on your cardiac medications before your next appointment, please call your pharmacy.  12/25/16 3:30 - call placed to patient, per Dr. Diona BrownerMcDowell - begin Florinef 0.1mg  daily.  [facility].  MD will see her back for follow up in 6 weeks.  Patient stated that she will have her husband call back so that we can explain instructions to him as well.  -- agh   12/25/16 4:53 - companion Elgie Congo(Wayne Green) returned call - notified & verbalized above instructions.  Follow up scheduled for 02/01/17 at 4:20 with Dr. Diona BrownerMcDowell here in [place] office.  --agh

## 2016-12-28 ENCOUNTER — Ambulatory Visit: Payer: Medicare Other | Admitting: Pediatrics

## 2016-12-28 DIAGNOSIS — S022XXD Fracture of nasal bones, subsequent encounter for fracture with routine healing: Secondary | ICD-10-CM | POA: Diagnosis not present

## 2016-12-28 DIAGNOSIS — M25511 Pain in right shoulder: Secondary | ICD-10-CM | POA: Diagnosis not present

## 2016-12-28 DIAGNOSIS — I4892 Unspecified atrial flutter: Secondary | ICD-10-CM | POA: Diagnosis not present

## 2016-12-28 DIAGNOSIS — R42 Dizziness and giddiness: Secondary | ICD-10-CM | POA: Diagnosis not present

## 2016-12-28 DIAGNOSIS — R55 Syncope and collapse: Secondary | ICD-10-CM | POA: Diagnosis not present

## 2016-12-28 DIAGNOSIS — E039 Hypothyroidism, unspecified: Secondary | ICD-10-CM | POA: Diagnosis not present

## 2016-12-28 DIAGNOSIS — Z9181 History of falling: Secondary | ICD-10-CM | POA: Diagnosis not present

## 2016-12-28 DIAGNOSIS — Z86711 Personal history of pulmonary embolism: Secondary | ICD-10-CM | POA: Diagnosis not present

## 2016-12-29 ENCOUNTER — Ambulatory Visit (INDEPENDENT_AMBULATORY_CARE_PROVIDER_SITE_OTHER): Payer: Medicare Other | Admitting: Pediatrics

## 2016-12-29 ENCOUNTER — Encounter: Payer: Self-pay | Admitting: Pediatrics

## 2016-12-29 VITALS — BP 126/72 | HR 65 | Temp 96.6°F | Ht 64.0 in | Wt 119.6 lb

## 2016-12-29 DIAGNOSIS — R634 Abnormal weight loss: Secondary | ICD-10-CM

## 2016-12-29 DIAGNOSIS — R627 Adult failure to thrive: Secondary | ICD-10-CM | POA: Diagnosis not present

## 2016-12-29 DIAGNOSIS — R531 Weakness: Secondary | ICD-10-CM | POA: Diagnosis not present

## 2016-12-29 DIAGNOSIS — R55 Syncope and collapse: Secondary | ICD-10-CM

## 2016-12-29 DIAGNOSIS — E039 Hypothyroidism, unspecified: Secondary | ICD-10-CM

## 2016-12-29 NOTE — Patient Instructions (Signed)
Ensure with every meal

## 2016-12-29 NOTE — Progress Notes (Signed)
  Subjective:   Patient ID: Crystal Cisneros, female    DOB: 1925-04-25, 83 y.o.   MRN: [id number] CC: Hospitalization Follow-up (Fall) multiple med problem f/u HPI: Crystal Cisneros is a 83 y.o. female presenting for Hospitalization Follow-up (Fall)  Here today with partner [name]), together since 1991 Was at [facility] a couple weeks ago for black out spells  Was hospitalized after two falls at home per pt report First time she fell onto backside, [place] didn't see She came out to ask him to come to bed with her after fall, she turned around to go back to the bedroom, fell again into door frame Husband says was incoherent, mumbling Came back to normal self after 20-25 sec Then started complaining of back pain from first fall He called 911, she was admitted for evaluation Had nasal fracture  No obvious cause of black out spells was identified in the hospital  Started on florinef last week for black out spells with cardiology  Per chart review had an ECHO while in the hospital  Now having home health PT, Patrick Jupiter says they have been pleased with her progress Still feels weak Having help getting to the bathroom and back  On oxygen at home for pulm HTN, has declined further pulm work up  [employer] three meals a day, ensure with some of the meals Appetite has been down for some months No weight loss over past 6 months, has lost about 15-20 lbs over past 2 years.  No abd pain  No fevers, no coughing Breathing at baseline No swelling in LE  Relevant past medical, surgical, family and social history reviewed. Allergies and medications reviewed and updated. History  Smoking Status  . Never Smoker  Smokeless Tobacco  . Never Used   ROS: Per HPI   Objective:    BP 126/72   Pulse 65   Temp (!) 96.6 F (35.9 C) (Oral)   Ht '5\' 4"'$  (1.626 m)   Wt 119 lb 9.6 oz (54.3 kg)   BMI 20.53 kg/m   Wt Readings from Last 3 Encounters:  12/29/16 119 lb 9.6 oz (54.3 kg)  12/25/16 120 lb  (54.4 kg)  07/06/16 118 lb 6.4 oz (53.7 kg)    Gen: NAD, alert, frail appearing elderly woman, cooperative with exam EYES: EOMI, no conjunctival injection, or no icterus ENT:  OP without erythema LYMPH: no cervical LAD CV: NRRR, normal S1/S2 Resp: CTABL, no wheezes, normal WOB Abd: +BS, soft, NTND.  Ext: No edema, warm Neuro: Alert and oriented Skin: some bruising b/l under eyes  Assessment & Plan:  Niva was seen today for hospitalization follow-up and multiple med problems.  Diagnoses and all orders for this visit:  Weight loss Add ensure to each meal, eating 3 meals regularly, weight stable over last few months -     CMP14+EGFR -     CBC with Differential/Platelet -     TSH  Hypothyroidism, unspecified type Check TSH, cont meds  Syncope, unspecified syncope type Saw cardiology last week, started on florinef Feeling ok so far, no further syncopal events Has f/u with cardiology in 4 weeks  Weakness Cont PT  Failure to thrive in adult Check labs, ensure with meals  Follow up plan: Return in about 2 months (around 02/28/2017). Assunta Found, MD [place]

## 2016-12-30 DIAGNOSIS — E039 Hypothyroidism, unspecified: Secondary | ICD-10-CM | POA: Diagnosis not present

## 2016-12-30 DIAGNOSIS — M25511 Pain in right shoulder: Secondary | ICD-10-CM | POA: Diagnosis not present

## 2016-12-30 DIAGNOSIS — R55 Syncope and collapse: Secondary | ICD-10-CM | POA: Diagnosis not present

## 2016-12-30 DIAGNOSIS — S022XXD Fracture of nasal bones, subsequent encounter for fracture with routine healing: Secondary | ICD-10-CM | POA: Diagnosis not present

## 2016-12-30 DIAGNOSIS — Z9181 History of falling: Secondary | ICD-10-CM | POA: Diagnosis not present

## 2016-12-30 DIAGNOSIS — Z86711 Personal history of pulmonary embolism: Secondary | ICD-10-CM | POA: Diagnosis not present

## 2016-12-30 DIAGNOSIS — I4892 Unspecified atrial flutter: Secondary | ICD-10-CM | POA: Diagnosis not present

## 2016-12-30 DIAGNOSIS — R42 Dizziness and giddiness: Secondary | ICD-10-CM | POA: Diagnosis not present

## 2016-12-30 LAB — CBC WITH DIFFERENTIAL/PLATELET
Basophils Absolute: 0 10*3/uL (ref 0.0–0.2)
Basos: 0 %
EOS (ABSOLUTE): 0.2 10*3/uL (ref 0.0–0.4)
EOS: 3 %
HEMATOCRIT: 40 % (ref 34.0–46.6)
HEMOGLOBIN: 13.5 g/dL (ref 11.1–15.9)
Immature Grans (Abs): 0 10*3/uL (ref 0.0–0.1)
Immature Granulocytes: 0 %
LYMPHS ABS: 3.1 10*3/uL (ref 0.7–3.1)
Lymphs: 45 %
MCH: 32.1 pg (ref 26.6–33.0)
MCHC: 33.8 g/dL (ref 31.5–35.7)
MCV: 95 fL (ref 79–97)
MONOCYTES: 11 %
Monocytes Absolute: 0.8 10*3/uL (ref 0.1–0.9)
NEUTROS ABS: 2.8 10*3/uL (ref 1.4–7.0)
Neutrophils: 41 %
Platelets: 288 10*3/uL (ref 150–379)
RBC: 4.2 x10E6/uL (ref 3.77–5.28)
RDW: 13.9 % (ref 12.3–15.4)
WBC: 6.9 10*3/uL (ref 3.4–10.8)

## 2016-12-30 LAB — CMP14+EGFR
ALBUMIN: 3.7 g/dL (ref 3.2–4.6)
ALT: 8 IU/L (ref 0–32)
AST: 19 IU/L (ref 0–40)
Albumin/Globulin Ratio: 1 — ABNORMAL LOW (ref 1.2–2.2)
Alkaline Phosphatase: 84 IU/L (ref 39–117)
BUN / CREAT RATIO: 27 (ref 12–28)
BUN: 20 mg/dL (ref 10–36)
Bilirubin Total: 0.4 mg/dL (ref 0.0–1.2)
CO2: 28 mmol/L (ref 18–29)
CREATININE: 0.74 mg/dL (ref 0.57–1.00)
Calcium: 9.9 mg/dL (ref 8.7–10.3)
Chloride: 99 mmol/L (ref 96–106)
GFR, EST AFRICAN AMERICAN: 82 mL/min/{1.73_m2} (ref >59)
GFR, EST NON AFRICAN AMERICAN: 72 mL/min/{1.73_m2} (ref >59)
Globulin, Total: 3.6 g/dL (ref 1.5–4.5)
Glucose: 93 mg/dL (ref 65–99)
Potassium: 4.4 mmol/L (ref 3.5–5.2)
Sodium: 140 mmol/L (ref 134–144)
TOTAL PROTEIN: 7.3 g/dL (ref 6.0–8.5)

## 2016-12-30 LAB — TSH: TSH: 2.06 u[IU]/mL (ref 0.450–4.500)

## 2016-12-31 DIAGNOSIS — R55 Syncope and collapse: Secondary | ICD-10-CM | POA: Diagnosis not present

## 2016-12-31 DIAGNOSIS — I4892 Unspecified atrial flutter: Secondary | ICD-10-CM | POA: Diagnosis not present

## 2016-12-31 DIAGNOSIS — Z9181 History of falling: Secondary | ICD-10-CM | POA: Diagnosis not present

## 2016-12-31 DIAGNOSIS — M25511 Pain in right shoulder: Secondary | ICD-10-CM | POA: Diagnosis not present

## 2016-12-31 DIAGNOSIS — S022XXD Fracture of nasal bones, subsequent encounter for fracture with routine healing: Secondary | ICD-10-CM | POA: Diagnosis not present

## 2016-12-31 DIAGNOSIS — E039 Hypothyroidism, unspecified: Secondary | ICD-10-CM | POA: Diagnosis not present

## 2016-12-31 DIAGNOSIS — R42 Dizziness and giddiness: Secondary | ICD-10-CM | POA: Diagnosis not present

## 2016-12-31 DIAGNOSIS — Z86711 Personal history of pulmonary embolism: Secondary | ICD-10-CM | POA: Diagnosis not present

## 2017-01-05 DIAGNOSIS — S022XXD Fracture of nasal bones, subsequent encounter for fracture with routine healing: Secondary | ICD-10-CM | POA: Diagnosis not present

## 2017-01-05 DIAGNOSIS — Z86711 Personal history of pulmonary embolism: Secondary | ICD-10-CM | POA: Diagnosis not present

## 2017-01-05 DIAGNOSIS — I4892 Unspecified atrial flutter: Secondary | ICD-10-CM | POA: Diagnosis not present

## 2017-01-05 DIAGNOSIS — M25511 Pain in right shoulder: Secondary | ICD-10-CM | POA: Diagnosis not present

## 2017-01-05 DIAGNOSIS — Z9181 History of falling: Secondary | ICD-10-CM | POA: Diagnosis not present

## 2017-01-05 DIAGNOSIS — R42 Dizziness and giddiness: Secondary | ICD-10-CM | POA: Diagnosis not present

## 2017-01-05 DIAGNOSIS — E039 Hypothyroidism, unspecified: Secondary | ICD-10-CM | POA: Diagnosis not present

## 2017-01-05 DIAGNOSIS — R55 Syncope and collapse: Secondary | ICD-10-CM | POA: Diagnosis not present

## 2017-01-08 DIAGNOSIS — Z86711 Personal history of pulmonary embolism: Secondary | ICD-10-CM | POA: Diagnosis not present

## 2017-01-08 DIAGNOSIS — M25511 Pain in right shoulder: Secondary | ICD-10-CM | POA: Diagnosis not present

## 2017-01-08 DIAGNOSIS — E039 Hypothyroidism, unspecified: Secondary | ICD-10-CM | POA: Diagnosis not present

## 2017-01-08 DIAGNOSIS — I4892 Unspecified atrial flutter: Secondary | ICD-10-CM | POA: Diagnosis not present

## 2017-01-08 DIAGNOSIS — R42 Dizziness and giddiness: Secondary | ICD-10-CM | POA: Diagnosis not present

## 2017-01-08 DIAGNOSIS — S022XXD Fracture of nasal bones, subsequent encounter for fracture with routine healing: Secondary | ICD-10-CM | POA: Diagnosis not present

## 2017-01-08 DIAGNOSIS — R55 Syncope and collapse: Secondary | ICD-10-CM | POA: Diagnosis not present

## 2017-01-08 DIAGNOSIS — Z9181 History of falling: Secondary | ICD-10-CM | POA: Diagnosis not present

## 2017-01-11 DIAGNOSIS — Z9181 History of falling: Secondary | ICD-10-CM | POA: Diagnosis not present

## 2017-01-11 DIAGNOSIS — I4892 Unspecified atrial flutter: Secondary | ICD-10-CM | POA: Diagnosis not present

## 2017-01-11 DIAGNOSIS — S022XXD Fracture of nasal bones, subsequent encounter for fracture with routine healing: Secondary | ICD-10-CM | POA: Diagnosis not present

## 2017-01-11 DIAGNOSIS — Z86711 Personal history of pulmonary embolism: Secondary | ICD-10-CM | POA: Diagnosis not present

## 2017-01-11 DIAGNOSIS — R55 Syncope and collapse: Secondary | ICD-10-CM | POA: Diagnosis not present

## 2017-01-11 DIAGNOSIS — M25511 Pain in right shoulder: Secondary | ICD-10-CM | POA: Diagnosis not present

## 2017-01-11 DIAGNOSIS — E039 Hypothyroidism, unspecified: Secondary | ICD-10-CM | POA: Diagnosis not present

## 2017-01-11 DIAGNOSIS — R42 Dizziness and giddiness: Secondary | ICD-10-CM | POA: Diagnosis not present

## 2017-01-13 DIAGNOSIS — R55 Syncope and collapse: Secondary | ICD-10-CM | POA: Diagnosis not present

## 2017-01-13 DIAGNOSIS — M25511 Pain in right shoulder: Secondary | ICD-10-CM | POA: Diagnosis not present

## 2017-01-13 DIAGNOSIS — Z86711 Personal history of pulmonary embolism: Secondary | ICD-10-CM | POA: Diagnosis not present

## 2017-01-13 DIAGNOSIS — Z9181 History of falling: Secondary | ICD-10-CM | POA: Diagnosis not present

## 2017-01-13 DIAGNOSIS — R42 Dizziness and giddiness: Secondary | ICD-10-CM | POA: Diagnosis not present

## 2017-01-13 DIAGNOSIS — I4892 Unspecified atrial flutter: Secondary | ICD-10-CM | POA: Diagnosis not present

## 2017-01-13 DIAGNOSIS — J9601 Acute respiratory failure with hypoxia: Secondary | ICD-10-CM | POA: Diagnosis not present

## 2017-01-13 DIAGNOSIS — E039 Hypothyroidism, unspecified: Secondary | ICD-10-CM | POA: Diagnosis not present

## 2017-01-13 DIAGNOSIS — S022XXD Fracture of nasal bones, subsequent encounter for fracture with routine healing: Secondary | ICD-10-CM | POA: Diagnosis not present

## 2017-01-20 ENCOUNTER — Telehealth: Payer: Self-pay | Admitting: Pediatrics

## 2017-02-01 ENCOUNTER — Ambulatory Visit (INDEPENDENT_AMBULATORY_CARE_PROVIDER_SITE_OTHER): Payer: Medicare Other | Admitting: Cardiology

## 2017-02-01 ENCOUNTER — Encounter: Payer: Self-pay | Admitting: Cardiology

## 2017-02-01 VITALS — BP 174/79 | HR 69 | Ht 64.0 in | Wt 118.0 lb

## 2017-02-01 DIAGNOSIS — I272 Pulmonary hypertension, unspecified: Secondary | ICD-10-CM | POA: Diagnosis not present

## 2017-02-01 DIAGNOSIS — R296 Repeated falls: Secondary | ICD-10-CM | POA: Diagnosis not present

## 2017-02-01 NOTE — Progress Notes (Signed)
Cardiology Office Note  Date: 02/01/2017   ID: Crystal Cisneros, DOB 08/26/1924, MRN [id number]  PCP: Johna SheriffVincent, Carol L, MD  Primary Cardiologist: Nona DellSamuel McDowell, MD   Chief Complaint  Patient presents with  . Cardiac follow-up    History of Present Illness: Crystal HeadMary L Cisneros is a 83 y.o. female last seen in May in follow-up of hospitalization at [facility] - please refer to last note for details. Since last visit we did try Florinef to try and avoid potential orthostasis given her previous falls. She presents today stating that she has felt better, no frank falls. Blood pressure was actually elevated today, but this has not typically been the case. She does not report any palpitations.  Past Medical History:  Diagnosis Date  . History of pulmonary embolus (PE)    Greenfield filter 2003  . History of syncope   . Hypothyroidism   . Paroxysmal atrial fibrillation (HCC)    Brief episodes noted 2003 - Dr. Ladona Ridgelaylor  . Pulmonary fibrosis (HCC)    Mild interstitial fibrosis by chest CT 2008  . Pulmonary hypertension (HCC)     Past Surgical History:  Procedure Laterality Date  . APPENDECTOMY    . CHOLECYSTECTOMY    . CRANIOTOMY  2003   Subdural hematoma  . TOTAL ABDOMINAL HYSTERECTOMY      Current Outpatient Prescriptions  Medication Sig Dispense Refill  . aspirin 81 MG tablet Take 81 mg by mouth daily as needed.     . fludrocortisone (FLORINEF) 0.1 MG tablet Take 1 tablet (0.1 mg total) by mouth daily. 30 tablet 6  . ibuprofen (ADVIL,MOTRIN) 200 MG tablet Take 200 mg by mouth every 6 (six) hours as needed.    Marland Kitchen. levothyroxine (SYNTHROID, LEVOTHROID) 75 MCG tablet Take 75 mcg by mouth daily before breakfast.      No current facility-administered medications for this visit.    Allergies:  Sulfa antibiotics   Social History: The patient  reports that she has never smoked. She has never used smokeless tobacco. She reports that she does not drink alcohol or use drugs.     ROS:  Please see the history of present illness. Otherwise, complete review of systems is positive for hearing loss.  All other systems are reviewed and negative.   Physical Exam: VS:  BP (!) 174/79   Pulse 69   Ht 5\' 4"  (1.626 m)   Wt 118 lb (53.5 kg)   LMP  (LMP Unknown)   BMI 20.25 kg/m , BMI Body mass index is 20.25 kg/m.  Wt Readings from Last 3 Encounters:  02/01/17 118 lb (53.5 kg)  12/29/16 119 lb 9.6 oz (54.3 kg)  12/25/16 120 lb (54.4 kg)    General: Frail-appearing, elderly woman wearing oxygen via nasal cannula. HEENT: Conjunctiva and lids normal, oropharynx clear. Resolving perinasal ecchymoses. Neck: Supple, no elevated JVP or carotid bruits, no thyromegaly. Lungs: Coarse but clear breath sounds, nonlabored breathing at rest. Cardiac: Regular rate and rhythm with ectopy, no S3, soft systolic murmur, no pericardial rub. Abdomen: Soft, nontender, bowel sounds present, no guarding or rebound. Extremities: No pitting edema, venous varicosities noted with some spider veins, distal pulses 2+.  ECG: I personally reviewed the tracing from 03/13/2016 which showed sinus rhythm with PAC, right bundle branch block and left anterior fascicular block.  Recent Labwork: 12/29/2016: ALT 8; AST 19; BUN 20; Creatinine, Ser 0.74; Hemoglobin 13.5; Platelets 288; Potassium 4.4; Sodium 140; TSH 2.060   Other Studies Reviewed Today:  Echocardiogram 12/10/2016 [facility]): Mild LVH with LVEF 50-55%, mild left atrial enlargement, normal right ventricular contraction, mildly thickened aortic leaflets without stenosis, mitral annular calcification with mild mitral regurgitation and mild mitral stenosis, mild tricuspid regurgitation, no pericardial effusion.  Assessment and Plan:  1. Recurrent falls, improved on Florinef suggesting the possibility of symptomatic orthostasis. She does have a history of brief transient episodes of atrial fibrillation, although it seems unlikely  that this has caused her falls or near syncope. We will continue with the current plan.  2. History of severe pulmonary hypertension with interstitial lung disease, oxygen requiring. She has declined further workup by Pulmonary.  Current medicines were reviewed with the patient today.   Disposition: Follow-up in 4 months.   Signed, Jonelle Sidle, MD, [facility] 02/01/2017 4:39 PM    [facility] at [facility] [address] [place], [place], [place] [address] Phone: [phone]; Fax: [phone]

## 2017-02-01 NOTE — Patient Instructions (Signed)
Your physician wants you to follow-up in: 4 MONTHS WITH DR. MCDOWELL You will receive a reminder letter in the mail two months in advance. If you don't receive a letter, please call our office to schedule the follow-up appointment.  Your physician recommends that you continue on your current medications as directed. Please refer to the Current Medication list given to you today.  Thank you for choosing [facility]!!    

## 2017-02-09 ENCOUNTER — Telehealth: Payer: Self-pay | Admitting: Pediatrics

## 2017-02-09 NOTE — Telephone Encounter (Signed)
What is the name of the medication? levothyroxine  Have you contacted your pharmacy to request a refill? yes  Which pharmacy would you like this sent to? [facility].   Patient notified that their request is being sent to the clinical staff for review and that they should receive a call once it is complete. If they do not receive a call within 24 hours they can check with their pharmacy or our office.

## 2017-02-10 MED ORDER — LEVOTHYROXINE SODIUM 75 MCG PO TABS: 75.0000 ug | ORAL_TABLET | Freq: Every day | ORAL | 2 refills | Status: DC

## 2017-02-10 NOTE — Telephone Encounter (Signed)
Aware Rx sent to pharmacy 

## 2017-02-13 DIAGNOSIS — J9601 Acute respiratory failure with hypoxia: Secondary | ICD-10-CM | POA: Diagnosis not present

## 2017-03-03 ENCOUNTER — Encounter: Payer: Self-pay | Admitting: Pediatrics

## 2017-03-03 ENCOUNTER — Ambulatory Visit (INDEPENDENT_AMBULATORY_CARE_PROVIDER_SITE_OTHER): Payer: Medicare Other | Admitting: Pediatrics

## 2017-03-03 VITALS — BP 121/62 | HR 66 | Temp 97.0°F | Resp 20 | Ht 64.0 in | Wt 117.2 lb

## 2017-03-03 DIAGNOSIS — R296 Repeated falls: Secondary | ICD-10-CM | POA: Diagnosis not present

## 2017-03-03 DIAGNOSIS — I270 Primary pulmonary hypertension: Secondary | ICD-10-CM | POA: Diagnosis not present

## 2017-03-03 DIAGNOSIS — R531 Weakness: Secondary | ICD-10-CM

## 2017-03-03 DIAGNOSIS — R627 Adult failure to thrive: Secondary | ICD-10-CM | POA: Diagnosis not present

## 2017-03-03 DIAGNOSIS — E039 Hypothyroidism, unspecified: Secondary | ICD-10-CM

## 2017-03-03 NOTE — Progress Notes (Signed)
  Subjective:   Patient ID: Crystal Cisneros, female    DOB: December 11, 1924, 83 y.o.   MRN: [id number] CC: Follow-up (2 month) med problems HPI: Crystal HeadMary L Cisneros is a [age] y.o. female presenting for Follow-up (2 month)  Eats a bowl of oatmeal and banana for breakfast Crystal Cisneros for lunch most days, eats fish, mashed potatos, coleslaw. Husband goes out to bring back for her Dinner sometimes just ice cream or eats nutrition drink   Using oxygen all of the time 2L now  BP usually about 120s/60s, similar to today  No further falls since starting florinef Continues to feel unsteady on her feet Not using walker or cane at home Says she doesn't feel confident moving around at home, at first was having husband help her get everywhere  Relevant past medical, surgical, family and social history reviewed. Allergies and medications reviewed and updated. History  Smoking Status  . Never Smoker  Smokeless Tobacco  . Never Used   ROS: Per HPI   Objective:    BP 121/62   Pulse 66   Temp (!) 97 F (36.1 C) (Oral)   Resp 20   Ht 5\' 4"  (1.626 m)   Wt 117 lb 3.2 oz (53.2 kg)   LMP  (LMP Unknown)   SpO2 93% Comment: 2L  BMI 20.12 kg/m   Wt Readings from Last 3 Encounters:  03/03/17 117 lb 3.2 oz (53.2 kg)  02/01/17 118 lb (53.5 kg)  12/29/16 119 lb 9.6 oz (54.3 kg)    Gen: NAD, alert, frail, cooperative with exam, NCAT EYES: EOMI, no conjunctival injection, or no icterus ENT: OP without erythema LYMPH: no cervical LAD CV: NRRR, normal S1/S2, no murmur Resp: faint crackles at bases, no wheezes, normal WOB Abd: +BS, soft, NTND.  Ext: No edema, warm Neuro: Alert and oriented  Assessment & Plan:  Crystal DandyMary was seen today for follow-up multiple med problems.  Diagnoses and all orders for this visit: Weakness Frequent falls [facility], helped with strengthening before Now walking minimally at home, feels unsteady though feeling has improved since being on florinef  Pulm  Hypertension Contributing to weakness, DOE On oxygen for pulm HTN Has chronic pulm fibrosis H/o PE years ago, has IVC filter  Failure to thrive in adult Eat three times a day, supplement with nutrition supplements  -     Ambulatory referral to Home Health -     Home Health -     Face-to-face encounter (required for Medicare/Medicaid patients)  Hypothyroidism, unspecified type TSH wnl, cont levothyroxine -     Ambulatory referral to Home Health -     Home Health -     Face-to-face encounter (required for Medicare/Medicaid patients)  Follow up plan: Return in about 3 months (around 06/03/2017). Rex Krasarol Vincent, MD [facility]

## 2017-03-15 DIAGNOSIS — J9601 Acute respiratory failure with hypoxia: Secondary | ICD-10-CM | POA: Diagnosis not present

## 2017-04-15 DIAGNOSIS — J9601 Acute respiratory failure with hypoxia: Secondary | ICD-10-CM | POA: Diagnosis not present

## 2017-04-24 DIAGNOSIS — S29012A Strain of muscle and tendon of back wall of thorax, initial encounter: Secondary | ICD-10-CM | POA: Diagnosis not present

## 2017-04-24 DIAGNOSIS — N200 Calculus of kidney: Secondary | ICD-10-CM | POA: Diagnosis not present

## 2017-04-24 DIAGNOSIS — R109 Unspecified abdominal pain: Secondary | ICD-10-CM | POA: Diagnosis not present

## 2017-04-24 DIAGNOSIS — S29011A Strain of muscle and tendon of front wall of thorax, initial encounter: Secondary | ICD-10-CM | POA: Diagnosis not present

## 2017-04-24 DIAGNOSIS — R079 Chest pain, unspecified: Secondary | ICD-10-CM | POA: Diagnosis not present

## 2017-05-11 DIAGNOSIS — S32010A Wedge compression fracture of first lumbar vertebra, initial encounter for closed fracture: Secondary | ICD-10-CM | POA: Diagnosis not present

## 2017-05-11 DIAGNOSIS — M5136 Other intervertebral disc degeneration, lumbar region: Secondary | ICD-10-CM | POA: Diagnosis not present

## 2017-05-11 DIAGNOSIS — M8588 Other specified disorders of bone density and structure, other site: Secondary | ICD-10-CM | POA: Diagnosis not present

## 2017-05-11 DIAGNOSIS — I7090 Unspecified atherosclerosis: Secondary | ICD-10-CM | POA: Diagnosis not present

## 2017-05-11 DIAGNOSIS — W010XXA Fall on same level from slipping, tripping and stumbling without subsequent striking against object, initial encounter: Secondary | ICD-10-CM | POA: Diagnosis not present

## 2017-05-11 DIAGNOSIS — M545 Low back pain: Secondary | ICD-10-CM | POA: Diagnosis not present

## 2017-05-11 DIAGNOSIS — Z79899 Other long term (current) drug therapy: Secondary | ICD-10-CM | POA: Diagnosis not present

## 2017-05-14 DIAGNOSIS — S32000B Wedge compression fracture of unspecified lumbar vertebra, initial encounter for open fracture: Secondary | ICD-10-CM | POA: Diagnosis not present

## 2017-05-14 DIAGNOSIS — M8448XA Pathological fracture, other site, initial encounter for fracture: Secondary | ICD-10-CM | POA: Diagnosis not present

## 2017-05-16 DIAGNOSIS — J9601 Acute respiratory failure with hypoxia: Secondary | ICD-10-CM | POA: Diagnosis not present

## 2017-05-20 ENCOUNTER — Telehealth: Payer: Self-pay | Admitting: Pediatrics

## 2017-05-20 DIAGNOSIS — Z79899 Other long term (current) drug therapy: Secondary | ICD-10-CM | POA: Diagnosis not present

## 2017-05-20 DIAGNOSIS — K5900 Constipation, unspecified: Secondary | ICD-10-CM | POA: Diagnosis not present

## 2017-05-20 NOTE — Telephone Encounter (Signed)
Daughter called at 10 this morning about her mom.   No one has called her and it is now late afternoon.  Called back and wanted [place] to know she is taking her mother to the emergency room.

## 2017-05-20 NOTE — Telephone Encounter (Signed)
Called to inform patient's daughter of recommendations.  Daughter states that she does not need help anymore that she has taken patient to the ER.

## 2017-05-20 NOTE — Telephone Encounter (Signed)
Milk of magnesia and 8oz of prune juice Then start miralax daily

## 2017-05-22 ENCOUNTER — Telehealth: Payer: Self-pay | Admitting: Student

## 2017-05-22 NOTE — Telephone Encounter (Signed)
   Patient's daughter reports her BP has been elevated, in the 170's/80's yesterday, at 164/80 this morning. She denies any current symptoms. At 138/77 when checked currently. She is on Florinef 0.1mg  daily for orthostatic hypotension. Recommended continuing with the medication at this time as she has suffered multiple falls in the past felt to be secondary to orthostasis. She has been suffering from constipation secondary to Tramadol and we reviewed taking Tylenol in place of this or cutting her Tramadol tablet in half when needing additional pain control.  She voiced understanding of this and was appreciative of the call.   Signed, Ellsworth Lennox, PA-C 05/22/2017, 1:47 PM Pager: [phone]

## 2017-05-25 ENCOUNTER — Telehealth: Payer: Self-pay | Admitting: Cardiology

## 2017-05-25 NOTE — Telephone Encounter (Signed)
Please call patient's daughter regarding symptoms. / tg

## 2017-05-25 NOTE — Telephone Encounter (Signed)
Spoke with Daughter who reports BP on yesterday of 171/98 after taking 1/2 dose of Florinef. BP on today is 138/72 before Florinef and 173/77 HR 64 after 1/2 dose of Florinef. Niece reports that pt was not dizzy at this time. Pt did complain of pain from recent fall. Please advise.

## 2017-05-25 NOTE — Telephone Encounter (Signed)
Noted. We have been using low-dose Florinef given this patient's frequent falls and concern that it may be related to orthostatic hypotension in light of her medical history. She reported to me at the last visit that she was doing better on the Florinef. Blood pressure elevations would be expected on a volume expander such as Florinef. It will be difficult to find a perfect treatment balancing symptomatic orthostatic hypotension and hypertension. If they would like to try coming off the Florinef and seeing how she does, compression hose might be an option to help with her orthostatic symptoms without further pushing on blood pressure.

## 2017-05-25 NOTE — Telephone Encounter (Signed)
Returned daughters call. She is unable to take call at this time and will call office back when returns home.

## 2017-05-26 ENCOUNTER — Telehealth: Payer: Self-pay | Admitting: Cardiology

## 2017-05-26 NOTE — Telephone Encounter (Signed)
Per pt's daughter her BP reading is 138/88 pulse 58

## 2017-05-26 NOTE — Telephone Encounter (Signed)
Daughter states they will stop Florinef for now and get compression hose.

## 2017-05-26 NOTE — Telephone Encounter (Signed)
Please give pt's daughter a call concerning her BP medication

## 2017-05-31 ENCOUNTER — Telehealth: Payer: Self-pay

## 2017-05-31 NOTE — Telephone Encounter (Signed)
Patients daughter notified and verbalized understanding 

## 2017-05-31 NOTE — Telephone Encounter (Signed)
Spoke with patients daughter regarding Blood pressures On Friday it was 122/80 Saturday 137/87 Sunday 139/84 Today BP is reading 171/95 (this morning) 168/86 (this evening) HR of 68  Patient was supposed to have appointment with Dr. Diona Browner tomorrow 10/9 but cancelled due to being in too much pain from fall. Patient did reschedule for next available with Dr. Diona Browner 11/2.  Daughter wants to know if these BP are ok or if they need to do anything to bring it down when it gets that high. Patient has been in a lot of pain from fall. Advised daughter that pain could elevate BP as well.

## 2017-05-31 NOTE — Telephone Encounter (Signed)
It looks like at baseline blood pressure has been very reasonable, so no changes in medication for now. The elevated blood pressures are likely contributed to by pain.

## 2017-06-01 ENCOUNTER — Ambulatory Visit: Payer: Self-pay | Admitting: Cardiology

## 2017-06-03 ENCOUNTER — Ambulatory Visit: Payer: Medicare Other | Admitting: Pediatrics

## 2017-06-03 DIAGNOSIS — R278 Other lack of coordination: Secondary | ICD-10-CM | POA: Diagnosis not present

## 2017-06-03 DIAGNOSIS — S32019A Unspecified fracture of first lumbar vertebra, initial encounter for closed fracture: Secondary | ICD-10-CM | POA: Diagnosis not present

## 2017-06-03 DIAGNOSIS — G4700 Insomnia, unspecified: Secondary | ICD-10-CM | POA: Diagnosis not present

## 2017-06-03 DIAGNOSIS — S32000G Wedge compression fracture of unspecified lumbar vertebra, subsequent encounter for fracture with delayed healing: Secondary | ICD-10-CM | POA: Diagnosis not present

## 2017-06-03 DIAGNOSIS — W1839XA Other fall on same level, initial encounter: Secondary | ICD-10-CM | POA: Diagnosis not present

## 2017-06-03 DIAGNOSIS — E038 Other specified hypothyroidism: Secondary | ICD-10-CM | POA: Diagnosis not present

## 2017-06-03 DIAGNOSIS — M6281 Muscle weakness (generalized): Secondary | ICD-10-CM | POA: Diagnosis not present

## 2017-06-03 DIAGNOSIS — Z6823 Body mass index (BMI) 23.0-23.9, adult: Secondary | ICD-10-CM | POA: Diagnosis not present

## 2017-06-03 DIAGNOSIS — E860 Dehydration: Secondary | ICD-10-CM | POA: Diagnosis not present

## 2017-06-03 DIAGNOSIS — S32019D Unspecified fracture of first lumbar vertebra, subsequent encounter for fracture with routine healing: Secondary | ICD-10-CM | POA: Diagnosis not present

## 2017-06-03 DIAGNOSIS — R262 Difficulty in walking, not elsewhere classified: Secondary | ICD-10-CM | POA: Diagnosis not present

## 2017-06-03 DIAGNOSIS — N390 Urinary tract infection, site not specified: Secondary | ICD-10-CM | POA: Diagnosis not present

## 2017-06-03 DIAGNOSIS — K2900 Acute gastritis without bleeding: Secondary | ICD-10-CM | POA: Diagnosis not present

## 2017-06-03 DIAGNOSIS — K5900 Constipation, unspecified: Secondary | ICD-10-CM | POA: Diagnosis not present

## 2017-06-03 DIAGNOSIS — M549 Dorsalgia, unspecified: Secondary | ICD-10-CM | POA: Diagnosis not present

## 2017-06-03 DIAGNOSIS — R1311 Dysphagia, oral phase: Secondary | ICD-10-CM | POA: Diagnosis not present

## 2017-06-03 DIAGNOSIS — K5903 Drug induced constipation: Secondary | ICD-10-CM | POA: Diagnosis not present

## 2017-06-03 DIAGNOSIS — J810 Acute pulmonary edema: Secondary | ICD-10-CM | POA: Diagnosis not present

## 2017-06-03 DIAGNOSIS — M545 Low back pain: Secondary | ICD-10-CM | POA: Diagnosis not present

## 2017-06-03 DIAGNOSIS — Z79899 Other long term (current) drug therapy: Secondary | ICD-10-CM | POA: Diagnosis not present

## 2017-06-03 DIAGNOSIS — R296 Repeated falls: Secondary | ICD-10-CM | POA: Diagnosis not present

## 2017-06-03 DIAGNOSIS — I951 Orthostatic hypotension: Secondary | ICD-10-CM | POA: Diagnosis not present

## 2017-06-03 DIAGNOSIS — Z9181 History of falling: Secondary | ICD-10-CM | POA: Diagnosis not present

## 2017-06-03 DIAGNOSIS — E039 Hypothyroidism, unspecified: Secondary | ICD-10-CM | POA: Diagnosis not present

## 2017-06-04 DIAGNOSIS — E860 Dehydration: Secondary | ICD-10-CM | POA: Diagnosis not present

## 2017-06-04 DIAGNOSIS — R262 Difficulty in walking, not elsewhere classified: Secondary | ICD-10-CM | POA: Diagnosis not present

## 2017-06-04 DIAGNOSIS — N390 Urinary tract infection, site not specified: Secondary | ICD-10-CM | POA: Diagnosis not present

## 2017-06-04 DIAGNOSIS — Z9181 History of falling: Secondary | ICD-10-CM | POA: Diagnosis not present

## 2017-06-04 DIAGNOSIS — E038 Other specified hypothyroidism: Secondary | ICD-10-CM | POA: Diagnosis not present

## 2017-06-04 DIAGNOSIS — E039 Hypothyroidism, unspecified: Secondary | ICD-10-CM | POA: Diagnosis not present

## 2017-06-04 DIAGNOSIS — Z6823 Body mass index (BMI) 23.0-23.9, adult: Secondary | ICD-10-CM | POA: Diagnosis not present

## 2017-06-04 DIAGNOSIS — Z79899 Other long term (current) drug therapy: Secondary | ICD-10-CM | POA: Diagnosis not present

## 2017-06-04 DIAGNOSIS — S32019A Unspecified fracture of first lumbar vertebra, initial encounter for closed fracture: Secondary | ICD-10-CM | POA: Diagnosis not present

## 2017-06-04 DIAGNOSIS — K5900 Constipation, unspecified: Secondary | ICD-10-CM | POA: Diagnosis not present

## 2017-06-05 DIAGNOSIS — E039 Hypothyroidism, unspecified: Secondary | ICD-10-CM | POA: Diagnosis not present

## 2017-06-05 DIAGNOSIS — M545 Low back pain: Secondary | ICD-10-CM | POA: Diagnosis not present

## 2017-06-05 DIAGNOSIS — J9601 Acute respiratory failure with hypoxia: Secondary | ICD-10-CM | POA: Diagnosis not present

## 2017-06-05 DIAGNOSIS — Z9181 History of falling: Secondary | ICD-10-CM | POA: Diagnosis not present

## 2017-06-05 DIAGNOSIS — K5903 Drug induced constipation: Secondary | ICD-10-CM | POA: Diagnosis not present

## 2017-06-05 DIAGNOSIS — M6281 Muscle weakness (generalized): Secondary | ICD-10-CM | POA: Diagnosis not present

## 2017-06-05 DIAGNOSIS — G4700 Insomnia, unspecified: Secondary | ICD-10-CM | POA: Diagnosis not present

## 2017-06-05 DIAGNOSIS — R278 Other lack of coordination: Secondary | ICD-10-CM | POA: Diagnosis not present

## 2017-06-05 DIAGNOSIS — S32019D Unspecified fracture of first lumbar vertebra, subsequent encounter for fracture with routine healing: Secondary | ICD-10-CM | POA: Diagnosis not present

## 2017-06-05 DIAGNOSIS — R262 Difficulty in walking, not elsewhere classified: Secondary | ICD-10-CM | POA: Diagnosis not present

## 2017-06-05 DIAGNOSIS — R1311 Dysphagia, oral phase: Secondary | ICD-10-CM | POA: Diagnosis not present

## 2017-06-09 ENCOUNTER — Telehealth: Payer: Self-pay | Admitting: *Deleted

## 2017-06-09 NOTE — Telephone Encounter (Signed)
Patient is in extended care at this time

## 2017-06-21 DIAGNOSIS — S32010D Wedge compression fracture of first lumbar vertebra, subsequent encounter for fracture with routine healing: Secondary | ICD-10-CM | POA: Diagnosis not present

## 2017-06-21 DIAGNOSIS — Z791 Long term (current) use of non-steroidal anti-inflammatories (NSAID): Secondary | ICD-10-CM | POA: Diagnosis not present

## 2017-06-21 DIAGNOSIS — E039 Hypothyroidism, unspecified: Secondary | ICD-10-CM | POA: Diagnosis not present

## 2017-06-21 DIAGNOSIS — M6281 Muscle weakness (generalized): Secondary | ICD-10-CM | POA: Diagnosis not present

## 2017-06-21 DIAGNOSIS — Z87440 Personal history of urinary (tract) infections: Secondary | ICD-10-CM | POA: Diagnosis not present

## 2017-06-21 DIAGNOSIS — R296 Repeated falls: Secondary | ICD-10-CM | POA: Diagnosis not present

## 2017-06-21 DIAGNOSIS — E559 Vitamin D deficiency, unspecified: Secondary | ICD-10-CM | POA: Diagnosis not present

## 2017-06-22 DIAGNOSIS — E039 Hypothyroidism, unspecified: Secondary | ICD-10-CM | POA: Diagnosis not present

## 2017-06-22 DIAGNOSIS — E559 Vitamin D deficiency, unspecified: Secondary | ICD-10-CM | POA: Diagnosis not present

## 2017-06-22 DIAGNOSIS — Z87440 Personal history of urinary (tract) infections: Secondary | ICD-10-CM | POA: Diagnosis not present

## 2017-06-22 DIAGNOSIS — S32010D Wedge compression fracture of first lumbar vertebra, subsequent encounter for fracture with routine healing: Secondary | ICD-10-CM | POA: Diagnosis not present

## 2017-06-22 DIAGNOSIS — M6281 Muscle weakness (generalized): Secondary | ICD-10-CM | POA: Diagnosis not present

## 2017-06-22 DIAGNOSIS — R296 Repeated falls: Secondary | ICD-10-CM | POA: Diagnosis not present

## 2017-06-22 DIAGNOSIS — Z791 Long term (current) use of non-steroidal anti-inflammatories (NSAID): Secondary | ICD-10-CM | POA: Diagnosis not present

## 2017-06-23 DIAGNOSIS — E559 Vitamin D deficiency, unspecified: Secondary | ICD-10-CM | POA: Diagnosis not present

## 2017-06-23 DIAGNOSIS — E039 Hypothyroidism, unspecified: Secondary | ICD-10-CM | POA: Diagnosis not present

## 2017-06-23 DIAGNOSIS — Z87440 Personal history of urinary (tract) infections: Secondary | ICD-10-CM | POA: Diagnosis not present

## 2017-06-23 DIAGNOSIS — Z791 Long term (current) use of non-steroidal anti-inflammatories (NSAID): Secondary | ICD-10-CM | POA: Diagnosis not present

## 2017-06-23 DIAGNOSIS — S32010D Wedge compression fracture of first lumbar vertebra, subsequent encounter for fracture with routine healing: Secondary | ICD-10-CM | POA: Diagnosis not present

## 2017-06-23 DIAGNOSIS — M6281 Muscle weakness (generalized): Secondary | ICD-10-CM | POA: Diagnosis not present

## 2017-06-23 DIAGNOSIS — R296 Repeated falls: Secondary | ICD-10-CM | POA: Diagnosis not present

## 2017-06-24 DIAGNOSIS — S32010D Wedge compression fracture of first lumbar vertebra, subsequent encounter for fracture with routine healing: Secondary | ICD-10-CM | POA: Diagnosis not present

## 2017-06-24 DIAGNOSIS — R296 Repeated falls: Secondary | ICD-10-CM | POA: Diagnosis not present

## 2017-06-24 DIAGNOSIS — M6281 Muscle weakness (generalized): Secondary | ICD-10-CM | POA: Diagnosis not present

## 2017-06-24 DIAGNOSIS — Z791 Long term (current) use of non-steroidal anti-inflammatories (NSAID): Secondary | ICD-10-CM | POA: Diagnosis not present

## 2017-06-24 DIAGNOSIS — E039 Hypothyroidism, unspecified: Secondary | ICD-10-CM | POA: Diagnosis not present

## 2017-06-24 DIAGNOSIS — Z87440 Personal history of urinary (tract) infections: Secondary | ICD-10-CM | POA: Diagnosis not present

## 2017-06-24 DIAGNOSIS — E559 Vitamin D deficiency, unspecified: Secondary | ICD-10-CM | POA: Diagnosis not present

## 2017-06-25 ENCOUNTER — Ambulatory Visit: Payer: Medicare Other | Admitting: Cardiology

## 2017-06-25 DIAGNOSIS — Z87440 Personal history of urinary (tract) infections: Secondary | ICD-10-CM | POA: Diagnosis not present

## 2017-06-25 DIAGNOSIS — E559 Vitamin D deficiency, unspecified: Secondary | ICD-10-CM | POA: Diagnosis not present

## 2017-06-25 DIAGNOSIS — S32010D Wedge compression fracture of first lumbar vertebra, subsequent encounter for fracture with routine healing: Secondary | ICD-10-CM | POA: Diagnosis not present

## 2017-06-25 DIAGNOSIS — R296 Repeated falls: Secondary | ICD-10-CM | POA: Diagnosis not present

## 2017-06-25 DIAGNOSIS — Z791 Long term (current) use of non-steroidal anti-inflammatories (NSAID): Secondary | ICD-10-CM | POA: Diagnosis not present

## 2017-06-25 DIAGNOSIS — E039 Hypothyroidism, unspecified: Secondary | ICD-10-CM | POA: Diagnosis not present

## 2017-06-25 DIAGNOSIS — M6281 Muscle weakness (generalized): Secondary | ICD-10-CM | POA: Diagnosis not present

## 2017-06-29 DIAGNOSIS — E559 Vitamin D deficiency, unspecified: Secondary | ICD-10-CM | POA: Diagnosis not present

## 2017-06-29 DIAGNOSIS — M6281 Muscle weakness (generalized): Secondary | ICD-10-CM | POA: Diagnosis not present

## 2017-06-29 DIAGNOSIS — S32010D Wedge compression fracture of first lumbar vertebra, subsequent encounter for fracture with routine healing: Secondary | ICD-10-CM | POA: Diagnosis not present

## 2017-06-29 DIAGNOSIS — R296 Repeated falls: Secondary | ICD-10-CM | POA: Diagnosis not present

## 2017-06-29 DIAGNOSIS — E039 Hypothyroidism, unspecified: Secondary | ICD-10-CM | POA: Diagnosis not present

## 2017-06-29 DIAGNOSIS — Z87440 Personal history of urinary (tract) infections: Secondary | ICD-10-CM | POA: Diagnosis not present

## 2017-06-29 DIAGNOSIS — Z791 Long term (current) use of non-steroidal anti-inflammatories (NSAID): Secondary | ICD-10-CM | POA: Diagnosis not present

## 2017-06-30 ENCOUNTER — Ambulatory Visit: Payer: Medicare Other | Admitting: Pediatrics

## 2017-06-30 DIAGNOSIS — Z791 Long term (current) use of non-steroidal anti-inflammatories (NSAID): Secondary | ICD-10-CM | POA: Diagnosis not present

## 2017-06-30 DIAGNOSIS — Z87440 Personal history of urinary (tract) infections: Secondary | ICD-10-CM | POA: Diagnosis not present

## 2017-06-30 DIAGNOSIS — M6281 Muscle weakness (generalized): Secondary | ICD-10-CM | POA: Diagnosis not present

## 2017-06-30 DIAGNOSIS — E039 Hypothyroidism, unspecified: Secondary | ICD-10-CM | POA: Diagnosis not present

## 2017-06-30 DIAGNOSIS — E559 Vitamin D deficiency, unspecified: Secondary | ICD-10-CM | POA: Diagnosis not present

## 2017-06-30 DIAGNOSIS — R296 Repeated falls: Secondary | ICD-10-CM | POA: Diagnosis not present

## 2017-06-30 DIAGNOSIS — S32010D Wedge compression fracture of first lumbar vertebra, subsequent encounter for fracture with routine healing: Secondary | ICD-10-CM | POA: Diagnosis not present

## 2017-07-01 DIAGNOSIS — E039 Hypothyroidism, unspecified: Secondary | ICD-10-CM | POA: Diagnosis not present

## 2017-07-01 DIAGNOSIS — S32010D Wedge compression fracture of first lumbar vertebra, subsequent encounter for fracture with routine healing: Secondary | ICD-10-CM | POA: Diagnosis not present

## 2017-07-01 DIAGNOSIS — E559 Vitamin D deficiency, unspecified: Secondary | ICD-10-CM | POA: Diagnosis not present

## 2017-07-01 DIAGNOSIS — Z791 Long term (current) use of non-steroidal anti-inflammatories (NSAID): Secondary | ICD-10-CM | POA: Diagnosis not present

## 2017-07-01 DIAGNOSIS — Z87440 Personal history of urinary (tract) infections: Secondary | ICD-10-CM | POA: Diagnosis not present

## 2017-07-01 DIAGNOSIS — M6281 Muscle weakness (generalized): Secondary | ICD-10-CM | POA: Diagnosis not present

## 2017-07-01 DIAGNOSIS — R296 Repeated falls: Secondary | ICD-10-CM | POA: Diagnosis not present

## 2017-07-07 ENCOUNTER — Other Ambulatory Visit: Payer: Self-pay | Admitting: *Deleted

## 2017-07-07 ENCOUNTER — Telehealth: Payer: Self-pay | Admitting: Cardiology

## 2017-07-07 ENCOUNTER — Encounter: Payer: Self-pay | Admitting: *Deleted

## 2017-07-07 DIAGNOSIS — Z791 Long term (current) use of non-steroidal anti-inflammatories (NSAID): Secondary | ICD-10-CM | POA: Diagnosis not present

## 2017-07-07 DIAGNOSIS — R296 Repeated falls: Secondary | ICD-10-CM | POA: Diagnosis not present

## 2017-07-07 DIAGNOSIS — E559 Vitamin D deficiency, unspecified: Secondary | ICD-10-CM | POA: Diagnosis not present

## 2017-07-07 DIAGNOSIS — E039 Hypothyroidism, unspecified: Secondary | ICD-10-CM | POA: Diagnosis not present

## 2017-07-07 DIAGNOSIS — Z87440 Personal history of urinary (tract) infections: Secondary | ICD-10-CM | POA: Diagnosis not present

## 2017-07-07 DIAGNOSIS — M6281 Muscle weakness (generalized): Secondary | ICD-10-CM | POA: Diagnosis not present

## 2017-07-07 DIAGNOSIS — S32010D Wedge compression fracture of first lumbar vertebra, subsequent encounter for fracture with routine healing: Secondary | ICD-10-CM | POA: Diagnosis not present

## 2017-07-07 NOTE — Telephone Encounter (Signed)
Notes have been requested from [facility].  May require OV as patient last seen in June.

## 2017-07-07 NOTE — Telephone Encounter (Signed)
Inetta Fermoina from [facility] called stating that patient's blood pressure is running low. States that [facility]. Patient is not taking the Fludrocortisone 0.1 mg. Home Health is wanting to see if Dr. Diona BrownerMcDowell thinks she needs to go back On this medication .  Please call [phone].

## 2017-07-07 NOTE — Telephone Encounter (Signed)
VM from Inetta Fermoina w/ [facility] Pt was on [facility] is out, [facility]

## 2017-07-07 NOTE — Telephone Encounter (Signed)
Can you get med list [facility] including all dosages of current medicines and update current med list? If she has not been seen since that hospitalization she needs to be seen.

## 2017-07-08 NOTE — Telephone Encounter (Signed)
07/07/2017 / 5:00 - Notes received from [facility].  Spoke with Inetta Fermoina with [facility] in regards to patient's low BP.    READINGS BELOW DONE BY NURSING STAFF:  10/29 - 118/64  80 11/1 - 120/78  76 (after walking) 11/6 - 110/62  68 11/14 - 106/60  80    READINGS BELOW DONE BY THERAPY:    10/29 - 102/54  68 11/2 - 102/60  84 11/6 - 124/78  84  11/8 - 124/68  92 (after exercise)  Patient's overall complaint is weakness & no energy.  No c/o dizziness.  Stated that they are actually having to d/c her this week due to her going out of town all of next week.    Patient last seen 02/01/2017.  Was due for f/u 06/01/17 but was cancelled due to pain in hip from fall.  Rescheduled for 06/25/17, but cancelled due to patient being in rehab.    OV scheduled with Herma CarsonMichelle Lenze, PA for Wednesday, 07/21/2017 in [place] office.    Hospital notes given to Dr. Diona BrownerMcDowell for his review & any recommendations in the meantime.

## 2017-07-08 NOTE — Telephone Encounter (Signed)
Crystal Cisneros w/ [facility] notified.  Will keep OV as scheduled as she is still overdue for follow up & recent rehab stay.

## 2017-07-08 NOTE — Telephone Encounter (Signed)
The blood pressures in this current note are not overly concerning, no systolics are under 100.  We had her on Florinef previously to avoid potential orthostasis, certainly reasonable to restart it.  Her weakness and lack of energy are likely multifactorial, particularly at age 83, and with a history of severe pulmonary hypertension and interstitial lung disease.

## 2017-07-08 NOTE — Telephone Encounter (Signed)
Med list updated.  [facility] states that patient needs a refill on Megace

## 2017-07-09 DIAGNOSIS — M6281 Muscle weakness (generalized): Secondary | ICD-10-CM | POA: Diagnosis not present

## 2017-07-09 DIAGNOSIS — Z791 Long term (current) use of non-steroidal anti-inflammatories (NSAID): Secondary | ICD-10-CM | POA: Diagnosis not present

## 2017-07-09 DIAGNOSIS — R296 Repeated falls: Secondary | ICD-10-CM | POA: Diagnosis not present

## 2017-07-09 DIAGNOSIS — E039 Hypothyroidism, unspecified: Secondary | ICD-10-CM | POA: Diagnosis not present

## 2017-07-09 DIAGNOSIS — Z87440 Personal history of urinary (tract) infections: Secondary | ICD-10-CM | POA: Diagnosis not present

## 2017-07-09 DIAGNOSIS — S32010D Wedge compression fracture of first lumbar vertebra, subsequent encounter for fracture with routine healing: Secondary | ICD-10-CM | POA: Diagnosis not present

## 2017-07-09 DIAGNOSIS — E559 Vitamin D deficiency, unspecified: Secondary | ICD-10-CM | POA: Diagnosis not present

## 2017-07-14 ENCOUNTER — Ambulatory Visit (INDEPENDENT_AMBULATORY_CARE_PROVIDER_SITE_OTHER): Payer: Medicare Other | Admitting: Pediatrics

## 2017-07-14 ENCOUNTER — Encounter: Payer: Self-pay | Admitting: Pediatrics

## 2017-07-14 VITALS — BP 126/84 | HR 86 | Temp 98.2°F | Ht 64.0 in | Wt 107.4 lb

## 2017-07-14 DIAGNOSIS — R627 Adult failure to thrive: Secondary | ICD-10-CM | POA: Diagnosis not present

## 2017-07-14 DIAGNOSIS — M8000XS Age-related osteoporosis with current pathological fracture, unspecified site, sequela: Secondary | ICD-10-CM | POA: Diagnosis not present

## 2017-07-14 DIAGNOSIS — F339 Major depressive disorder, recurrent, unspecified: Secondary | ICD-10-CM

## 2017-07-14 MED ORDER — ALENDRONATE SODIUM 70 MG PO TABS: 70.0000 mg | ORAL_TABLET | ORAL | 11 refills | Status: DC

## 2017-07-14 MED ORDER — MIRTAZAPINE 7.5 MG PO TABS: 7.5000 mg | ORAL_TABLET | Freq: Every day | ORAL | 3 refills | Status: DC

## 2017-07-14 NOTE — Progress Notes (Signed)
Subjective:   Patient ID: Crystal HeadMary L Kontz, female    DOB: Nov 18, 1924, 83 y.o.   MRN: [id number] CC: Follow-up medical problems HPI: Crystal Cisneros is a 83 y.o. female presenting for Follow-up  Here today with husband  Had a fall in 11/2016, fall again about 6-7 weeks ago Found to have lumbar area compression fracture after most recent fall when seen in ED Kept in hospital for 3 days then transferred to extended care, put on tramadol for pain control. Didn't help. Hydrocodone made her act funny so they stopped it Was getting ibuprofen and xtra strength tylenol Then got constipated Drank miralax, constipation improved  Continues to have low appetite Was put on megace at extended care, husband and pt dont think that it helped at all  BP got elevated to 170s100s when she was at extended care Florinef was discontinued BP has come back down since then Was 90s/50s yesterday Husband restarted florinef, BP has been in 120s  Feels weak after being up for about an hour in the morning Eating about 1/3 of her usual oatmela for breakfast Parts of meal for lunch and dinner as well Pt says she thinks her breathing has is more labored when she is up moving around Has h/o interstitial lung disease on oxygen, further work up declined in the past for family Husband says oxygen levels around 96% when he checks it at home He says she gets around pretty well at home when using wheelchair Today says she gets weak feeling and has to lie down multiple times throughout the day  Husband asks about depression Pt says her mood has been down with all of recent medical problems recently ongoing  Relevant past medical, surgical, family and social history reviewed. Allergies and medications reviewed and updated. Social History   Tobacco Use  Smoking Status Never Smoker  Smokeless Tobacco Never Used   ROS: Per HPI   Objective:    BP 126/84   Pulse 86   Temp 98.2 F (36.8 C) (Oral)   Ht 5\' 4"  (1.626 m)    Wt 107 lb 6.4 oz (48.7 kg)   LMP  (LMP Unknown)   BMI 18.44 kg/m   Wt Readings from Last 3 Encounters:  07/14/17 107 lb 6.4 oz (48.7 kg)  03/03/17 117 lb 3.2 oz (53.2 kg)  02/01/17 118 lb (53.5 kg)    Gen: NAD, alert, frail, elderly woman, cooperative with exam, NCAT EYES: EOMI, no conjunctival injection, or no icterus ENT:  TMs pearly gray b/l, OP without erythema LYMPH: no cervical LAD CV: NRRR, normal S1/S2, no murmur, distal pulses 2+ b/l Resp: CTABL, no wheezes, normal WOB Abd: +BS, soft, NTND. no guarding or organomegaly Ext: No edema, warm Neuro: Alert and oriented, strength equal b/l UE and LE, needs assistance with standing up and getting on exam table  Assessment & Plan:  Corrie DandyMary was seen today for follow-up multiple med problems  Diagnoses and all orders for this visit:  Osteoporosis with current pathological fracture, unspecified osteoporosis type, sequela Compression fracture, treat with below -     alendronate (FOSAMAX) 70 MG tablet; Take 1 tablet (70 mg total) by mouth every 7 (seven) days. Take with a full glass of water on an empty stomach.  Depression, recurrent (HCC) Start mirtazepine, f/u in 4 weeks -     mirtazapine (REMERON) 7.5 MG tablet; Take 1 tablet (7.5 mg total) by mouth at bedtime.  Compression fracture Cont tylenol Can re-try tramadol, did not cause confusion Take stool  regimen while on it  Failure to thrive in adult Likely multi factorial Pain from recent compression fracture certainly contributing In physical therapy now to regain strength, pt says she isnt sure it is helping but also says she wants to continue Will treat depression as above OK to stop megace, has not been helping Pt with interstitial lung disease, pulm HTN, on chronic oxygen Husband does much of ADLs at home now He says he is able to continue, does not need more assistance than what they have at home already now through home health Pt expresses dislike for coming to the  doctors office Discussed palliative care as an option to help manage symptoms as above and help avoid unnecessary trips to doctor if interested in future. Pt and husband decline for now.  Follow up plan: Return in about 4 weeks (around 08/11/2017). Rex Krasarol Issac Moure, MD [facility]

## 2017-07-16 DIAGNOSIS — J9601 Acute respiratory failure with hypoxia: Secondary | ICD-10-CM | POA: Diagnosis not present

## 2017-07-21 ENCOUNTER — Ambulatory Visit: Payer: Self-pay | Admitting: Physician Assistant

## 2017-08-12 ENCOUNTER — Ambulatory Visit: Payer: Medicare Other | Admitting: Pediatrics

## 2017-08-15 DIAGNOSIS — J9601 Acute respiratory failure with hypoxia: Secondary | ICD-10-CM | POA: Diagnosis not present

## 2017-08-25 ENCOUNTER — Ambulatory Visit: Payer: Medicare Other | Admitting: Cardiology

## 2017-09-15 DIAGNOSIS — J9601 Acute respiratory failure with hypoxia: Secondary | ICD-10-CM | POA: Diagnosis not present

## 2017-10-16 DIAGNOSIS — J9601 Acute respiratory failure with hypoxia: Secondary | ICD-10-CM | POA: Diagnosis not present

## 2017-11-13 DIAGNOSIS — J9601 Acute respiratory failure with hypoxia: Secondary | ICD-10-CM | POA: Diagnosis not present

## 2017-11-26 ENCOUNTER — Other Ambulatory Visit: Payer: Self-pay | Admitting: Pediatrics

## 2017-12-24 ENCOUNTER — Other Ambulatory Visit: Payer: Self-pay | Admitting: Pediatrics

## 2017-12-24 MED ORDER — LEVOTHYROXINE SODIUM 75 MCG PO TABS: ORAL_TABLET | ORAL | 0 refills | Status: DC

## 2017-12-24 NOTE — Addendum Note (Signed)
Addended by: Julious Payer D on: 12/24/2017 09:53 AM   Modules accepted: Orders

## 2017-12-24 NOTE — Telephone Encounter (Signed)
Appt made for 01/13/18

## 2018-01-13 ENCOUNTER — Ambulatory Visit: Payer: Medicare Other | Admitting: Pediatrics

## 2018-01-25 ENCOUNTER — Other Ambulatory Visit: Payer: Self-pay | Admitting: Pediatrics

## 2018-01-27 ENCOUNTER — Ambulatory Visit (INDEPENDENT_AMBULATORY_CARE_PROVIDER_SITE_OTHER): Payer: Medicare Other | Admitting: Pediatrics

## 2018-01-27 ENCOUNTER — Encounter: Payer: Self-pay | Admitting: Pediatrics

## 2018-01-27 VITALS — BP 129/78 | HR 67 | Temp 97.0°F | Ht 64.0 in | Wt 102.4 lb

## 2018-01-27 DIAGNOSIS — I270 Primary pulmonary hypertension: Secondary | ICD-10-CM | POA: Diagnosis not present

## 2018-01-27 DIAGNOSIS — E039 Hypothyroidism, unspecified: Secondary | ICD-10-CM

## 2018-01-27 DIAGNOSIS — R634 Abnormal weight loss: Secondary | ICD-10-CM | POA: Diagnosis not present

## 2018-01-27 DIAGNOSIS — J9601 Acute respiratory failure with hypoxia: Secondary | ICD-10-CM | POA: Diagnosis not present

## 2018-01-27 DIAGNOSIS — K5900 Constipation, unspecified: Secondary | ICD-10-CM

## 2018-01-27 MED ORDER — LEVOTHYROXINE SODIUM 75 MCG PO TABS: 75.0000 ug | ORAL_TABLET | Freq: Every day | ORAL | 1 refills | Status: DC

## 2018-01-27 NOTE — Progress Notes (Signed)
Subjective:   Patient ID: Crystal Cisneros, female    DOB: 1925/07/25, 84 y.o.   MRN: [id number] CC: Medical Management of Chronic Issues  HPI: Crystal Cisneros is a 84 y.o. female  Here today with her significant other. They say overall they are pleased with how she is been doing at home.  She uses a walker when he is not around for stability.  No recent falls.  Usually she is able to get around without a walker but she feels more comfortable with it because she worries about falls.  Appetite has been ok. Eating breakfast, lean meat with vegetable for lunch, supper. Nutrition shake. Drinking one 12oz coke and glass of milk for fluids in the day.  They say she is increased some weight over the last few weeks.  [place]  She does have a hard time with constipation.  Having stools every 1 to 3 days, usually after pushing and straining.  Stools are soft after initial hard stool.  No fevers.  No abdominal pain.  No change in her breathing.  She uses oxygen at home for pulmonary hypertension when needed.  They are not sure why she stopped taking the mirtazapine or if she even started it following visit.  She says her mood is been doing pretty well right now.  Relevant past medical, surgical, family and social history reviewed. Allergies and medications reviewed and updated. Social History   Tobacco Use  Smoking Status Never Smoker  Smokeless Tobacco Never Used   ROS: Per HPI   Objective:    BP 129/78   Pulse 67   Temp (!) 97 F (36.1 C) (Oral)   Ht '5\' 4"'$  (1.626 m)   Wt 102 lb 6.4 oz (46.4 kg)   LMP  (LMP Unknown)   BMI 17.58 kg/m   Wt Readings from Last 3 Encounters:  01/27/18 102 lb 6.4 oz (46.4 kg)  07/14/17 107 lb 6.4 oz (48.7 kg)  03/03/17 117 lb 3.2 oz (53.2 kg)    Gen: NAD, alert, cooperative with exam EYES: EOMI, no conjunctival injection, or no icterus ENT:  TMs pearly gray b/l, OP without erythema LYMPH: no cervical LAD CV:  NRRR, normal S1/S2, no murmur, distal pulses 2+ b/l Resp: CTABL, no wheezes, normal WOB Abd: +BS, soft, NTND. no guarding or organomegaly Ext: No edema, warm Neuro: Alert and oriented, strength equal b/l UE and LE, coordination grossly normal MSK: normal muscle bulk  Assessment & Plan:  [name] was seen today for medical management of chronic issues.  Diagnoses and all orders for this visit:  Hypothyroidism, unspecified type Stable, due for recheck TSH. -     levothyroxine (SYNTHROID, LEVOTHROID) 75 MCG tablet; Take 1 tablet (75 mcg total) by mouth daily before breakfast. -     BMP8+EGFR -     TSH  Pulmonary hypertension, primary (HCC) Stable.  Patient is opted for supportive care, no further work-up at this time.  She is been feeling well over the last 6 months.  Weight loss Over the last year has lost about 15 pounds.  She says her appetite is better now than it has been.  She is eating regular meals.  Discussed adding additional nutrition shakes 1-2 extra times a day, eating more often even if they are smaller meals.  Significant other thinks that she has gained some weight over the last couple weeks.  Constipation Not drinking much fluid overall.  Rarely taking MiraLAX.  Is eating a fair amount of fiber.  Can do trial of MiraLAX, take as needed for goal soft stools every 1 to 2 days.  Follow up plan: Follow-up in 3 to 6 months. Assunta Found, MD [place]

## 2018-01-28 ENCOUNTER — Encounter: Payer: Self-pay | Admitting: Pediatrics

## 2018-01-28 DIAGNOSIS — R634 Abnormal weight loss: Secondary | ICD-10-CM | POA: Insufficient documentation

## 2018-01-28 LAB — BMP8+EGFR
BUN/Creatinine Ratio: 26 (ref 12–28)
BUN: 18 mg/dL (ref 10–36)
CO2: 23 mmol/L (ref 20–29)
CREATININE: 0.7 mg/dL (ref 0.57–1.00)
Calcium: 9.6 mg/dL (ref 8.7–10.3)
Chloride: 103 mmol/L (ref 96–106)
GFR calc non Af Amer: 76 mL/min/{1.73_m2} (ref >59)
GFR, EST AFRICAN AMERICAN: 88 mL/min/{1.73_m2} (ref >59)
Glucose: 91 mg/dL (ref 65–99)
Potassium: 4.9 mmol/L (ref 3.5–5.2)
SODIUM: 141 mmol/L (ref 134–144)

## 2018-01-28 LAB — TSH: TSH: 1.15 u[IU]/mL (ref 0.450–4.500)

## 2018-03-15 DIAGNOSIS — J9601 Acute respiratory failure with hypoxia: Secondary | ICD-10-CM | POA: Diagnosis not present

## 2018-03-16 ENCOUNTER — Ambulatory Visit (INDEPENDENT_AMBULATORY_CARE_PROVIDER_SITE_OTHER): Payer: Medicare Other

## 2018-03-16 ENCOUNTER — Encounter: Payer: Self-pay | Admitting: Orthopaedic Surgery

## 2018-03-16 ENCOUNTER — Ambulatory Visit: Payer: Medicare Other | Admitting: Orthopaedic Surgery

## 2018-03-16 VITALS — BP 124/74 | HR 68 | Ht 60.5 in | Wt 101.0 lb

## 2018-03-16 DIAGNOSIS — M79641 Pain in right hand: Secondary | ICD-10-CM | POA: Diagnosis not present

## 2018-03-16 NOTE — Progress Notes (Signed)
Subjective:    Patient ID: Crystal Cisneros, female    DOB: [birth year], 84 y.o.   MRN: [id number]  HPI She woke up three days ago with pain and swelling of the right dominant hand of the long finger MCP joint.  She had some redness.  She denies any trauma or insect bite. She says it hurt real bad.  She used ice and it has gotten better. She says the redness went away last night and she can move the finger now very well again.  She did not take any medicine for it.   Review of Systems  Constitutional: Positive for activity change.  Musculoskeletal: Positive for arthralgias, back pain, gait problem, joint swelling and myalgias.  All other systems reviewed and are negative.  For Review of Systems, all other systems reviewed and are negative.  Past Medical History:  Diagnosis Date  . History of pulmonary embolus (PE)    [place] filter 2003  . History of syncope   . Hypothyroidism   . Paroxysmal atrial fibrillation (HCC)    Brief episodes noted 2003 - Dr. Ladona Ridgel  . Pulmonary fibrosis (HCC)    Mild interstitial fibrosis by chest CT 2008  . Pulmonary hypertension (HCC)     Past Surgical History:  Procedure Laterality Date  . APPENDECTOMY    . CHOLECYSTECTOMY    . CRANIOTOMY  2003   Subdural hematoma  . TOTAL ABDOMINAL HYSTERECTOMY      Current Outpatient Medications on File Prior to Visit  Medication Sig Dispense Refill  . aspirin 81 MG tablet Take 81 mg by mouth daily as needed.     Tery Sanfilippo Calcium (STOOL SOFTENER PO) Take as needed by mouth.    Marland Kitchen ibuprofen (ADVIL,MOTRIN) 200 MG tablet Take 200 mg by mouth every 6 (six) hours as needed.    Marland Kitchen levothyroxine (SYNTHROID, LEVOTHROID) 75 MCG tablet Take 1 tablet (75 mcg total) by mouth daily before breakfast. 90 tablet 1  . polyethylene glycol powder (GLYCOLAX/MIRALAX) powder Take 1 Container once by mouth.     No current facility-administered medications on file prior to visit.     Social History   Socioeconomic History    . Marital status: Widowed    Spouse name: Not on file  . Number of children: Not on file  . Years of education: Not on file  . Highest education level: Not on file  Occupational History  . Not on file  Social Needs  . Financial resource strain: Not on file  . Food insecurity:    Worry: Not on file    Inability: Not on file  . Transportation needs:    Medical: Not on file    Non-medical: Not on file  Tobacco Use  . Smoking status: Never Smoker  . Smokeless tobacco: Never Used  Substance and Sexual Activity  . Alcohol use: No    Alcohol/week: 0.0 oz  . Drug use: No  . Sexual activity: Not on file  Lifestyle  . Physical activity:    Days per week: Not on file    Minutes per session: Not on file  . Stress: Not on file  Relationships  . Social connections:    Talks on phone: Not on file    Gets together: Not on file    Attends religious service: Not on file    Active member of club or organization: Not on file    Attends meetings of clubs or organizations: Not on file    Relationship status:  Not on file  . Intimate partner violence:    Fear of current or ex partner: Not on file    Emotionally abused: Not on file    Physically abused: Not on file    Forced sexual activity: Not on file  Other Topics Concern  . Not on file  Social History Narrative  . Not on file    Family History  Problem Relation Age of Onset  . Heart attack Father [id number]  . Dementia Mother     BP 124/74   Pulse 68   Ht 5' 0.5" (1.537 m)   Wt 101 lb (45.8 kg)   LMP  (LMP Unknown)   BMI 19.40 kg/m   Body mass index is 19.4 kg/m.      Objective:   Physical Exam  Constitutional: She is oriented to person, place, and time. She appears well-developed and well-nourished.  HENT:  Cisneros: Normocephalic and atraumatic.  Eyes: Pupils are equal, round, and reactive to light. Conjunctivae and EOM are normal.  Neck: Normal range of motion. Neck supple.  Cardiovascular: Normal rate, regular rhythm and  intact distal pulses.  Pulmonary/Chest: Effort normal.  Abdominal: Soft.  Musculoskeletal:       Hands: Neurological: She is alert and oriented to person, place, and time. She has normal reflexes. She displays normal reflexes. No cranial nerve deficit. She exhibits normal muscle tone. Coordination normal.  Skin: Skin is warm and dry.  Psychiatric: She has a normal mood and affect. Her behavior is normal. Judgment and thought content normal.     X-rays were done of the right hand, reported separately.     Assessment & Plan:   Encounter Diagnosis  Name Primary?  . Right hand pain Yes   I have recommended Aspercreme to the right hand over the third MCP joint area.    She uses a walker.  She has lost a lot of weight this year and I will not give NSAID at this time as she needs to eat more and I do not want to upset her stomach.  Return in two weeks.  Call if any problem.  Precautions discussed.   Electronically Signed Darreld McleanWayne Lesle Faron, MD 7/24/20192:22 PM

## 2018-03-21 ENCOUNTER — Telehealth: Payer: Self-pay | Admitting: Pediatrics

## 2018-03-21 NOTE — Telephone Encounter (Signed)
What symptoms do you have? Has a rash on her chest that don't look right  How long have you been sick? Couple week  Have you been seen for this problem? NO  If your provider decides to give you a prescription, which pharmacy would you like for it to be sent to? [facility]   Patient informed that this information will be sent to the clinical staff for review and that they should receive a follow up call.

## 2018-03-21 NOTE — Telephone Encounter (Signed)
Pt wanted an appt with Dr Oswaldo DoneVincent so she could see the rash. Appt given for 7/30/19at 11:15 with Dr Oswaldo DoneVincent.

## 2018-03-22 ENCOUNTER — Ambulatory Visit (INDEPENDENT_AMBULATORY_CARE_PROVIDER_SITE_OTHER): Payer: Medicare Other | Admitting: Pediatrics

## 2018-03-22 ENCOUNTER — Encounter: Payer: Self-pay | Admitting: Pediatrics

## 2018-03-22 VITALS — BP 130/80 | HR 74 | Temp 97.0°F | Ht 60.5 in | Wt 101.8 lb

## 2018-03-22 DIAGNOSIS — L249 Irritant contact dermatitis, unspecified cause: Secondary | ICD-10-CM | POA: Diagnosis not present

## 2018-03-22 MED ORDER — TRIAMCINOLONE ACETONIDE 0.025 % EX OINT
1.0000 | TOPICAL_OINTMENT | Freq: Two times a day (BID) | CUTANEOUS | 0 refills | Status: DC
Start: 2018-03-22 — End: 2018-09-20

## 2018-03-22 NOTE — Progress Notes (Signed)
  Subjective:   Patient ID: Crystal Cisneros, female    DOB: [birth year], 84 y.o.   MRN: [id number] CC: Rash (Chest)  HPI: Crystal Cisneros is a 84 y.o. female   Here today with her significant other.  For the last 2 to 3 months she has noticed a patchy salmon-colored rash on her bilateral upper chest.  It comes and goes.  She thinks it is where she sprays perfume.  She wonders if this could be worsening it.  It does not itch.  She is been putting lotion on it regularly.  No rash anywhere else on her body.  Relevant past medical, surgical, family and social history reviewed. Allergies and medications reviewed and updated. Social History   Tobacco Use  Smoking Status Never Smoker  Smokeless Tobacco Never Used   ROS: Per HPI   Objective:    BP 130/80   Pulse 74   Temp (!) 97 F (36.1 C) (Oral)   Ht 5' 0.5" (1.537 m)   Wt 101 lb 12.8 oz (46.2 kg)   LMP  (LMP Unknown)   BMI 19.55 kg/m   Wt Readings from Last 3 Encounters:  03/22/18 101 lb 12.8 oz (46.2 kg)  03/16/18 101 lb (45.8 kg)  01/27/18 102 lb 6.4 oz (46.4 kg)    Gen: NAD, alert, cooperative with exam, NCAT EYES: EOMI, no conjunctival injection, or no icterus CV: NRRR, normal S1/S2, no murmur, distal pulses 2+ b/l Resp: CTABL, no wheezes, normal WOB Ext: No edema, warm Neuro: Alert and oriented Skin: About 10 cm of patchy salmon-colored slightly raised plaques 3-727mm on bilateral anterior shoulders.  Assessment & Plan:  Crystal Cisneros was seen today for rash.  Diagnoses and all orders for this visit:  Irritant contact dermatitis, unspecified trigger Rash does not itch.  Has not been growing.  Has not been improving.  Not in sun exposed areas.  She thinks her perfume may be causing it.  Recommended stopping perfume.  Try below.  If not improving let me know.  Offered referral to dermatology, patient wants to wait. -     triamcinolone (KENALOG) 0.025 % ointment; Apply 1 application topically 2 (two) times daily.   Follow up  plan: Return in about 1 month (around 04/19/2018). Rex Krasarol Arno Cullers, MD [facility]

## 2018-03-26 ENCOUNTER — Encounter: Payer: Self-pay | Admitting: Pediatrics

## 2018-03-30 ENCOUNTER — Ambulatory Visit: Payer: Medicare Other | Admitting: Orthopaedic Surgery

## 2018-04-02 IMAGING — CT CT HEAD W/O CM
2 series · 15 of 30 positions shown, 19 images · non-contrast
Comparison: Head CT dated 10/28/2004.

CLINICAL DATA: Transient left arm numbness. Left arm numbness
starting at [DATE] a.m. today. At this time, numbness has resolved.

EXAM:
CT HEAD WITHOUT CONTRAST
TECHNIQUE: Contiguous axial images were obtained from the base of the skull
through the vertex without intravenous contrast.

[Series 2: head w/o · axial · non-contrast · 0.44mm/px · z∈[-38,+102]mm · 13 of 41 slices shown, 17 images]
[im 3/41  brain]
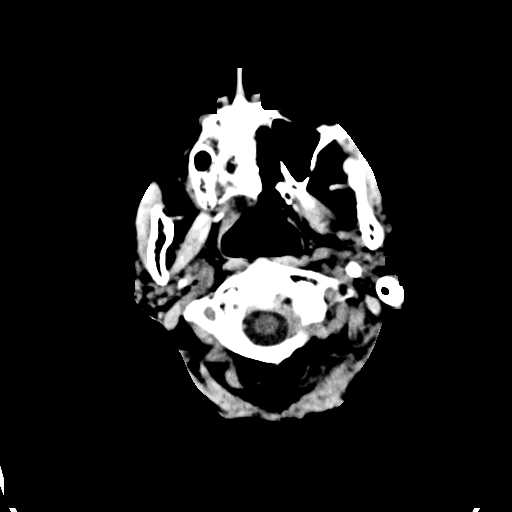
[im 3/41  bone]
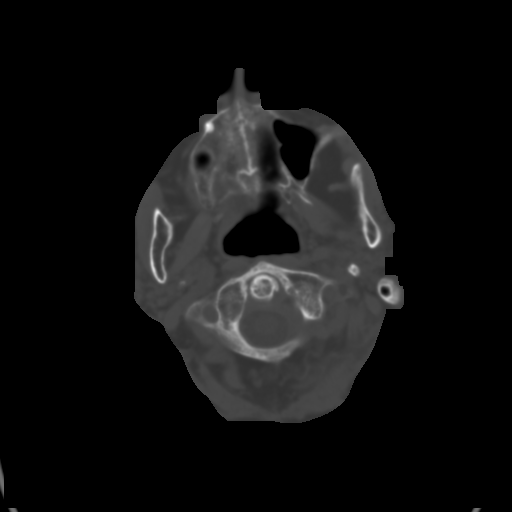
[im 6/41  brain]
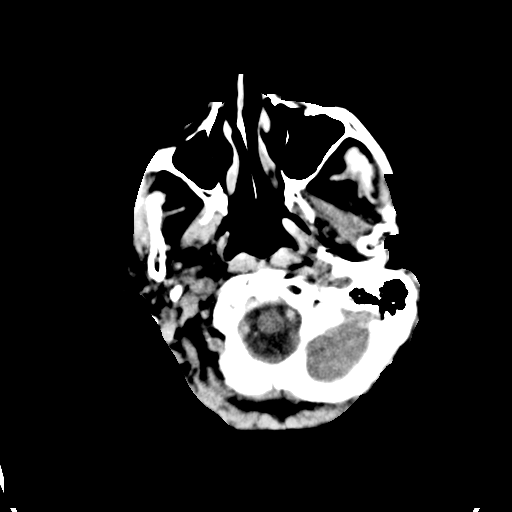
[im 9/41  brain]
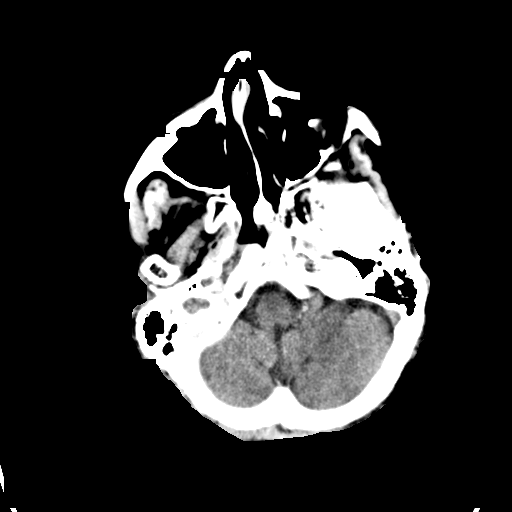
[im 12/41  brain]
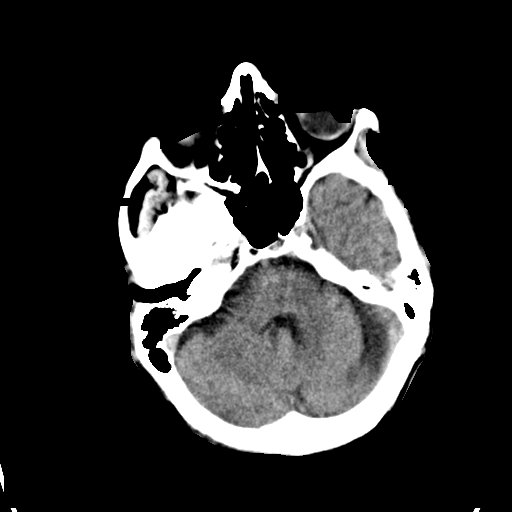
[im 15/41  brain]
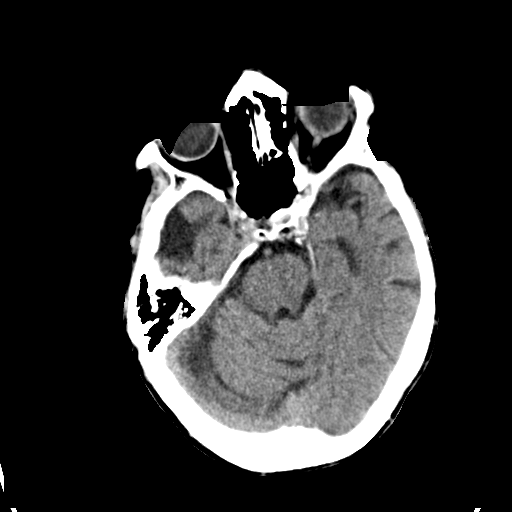
[im 15/41  bone]
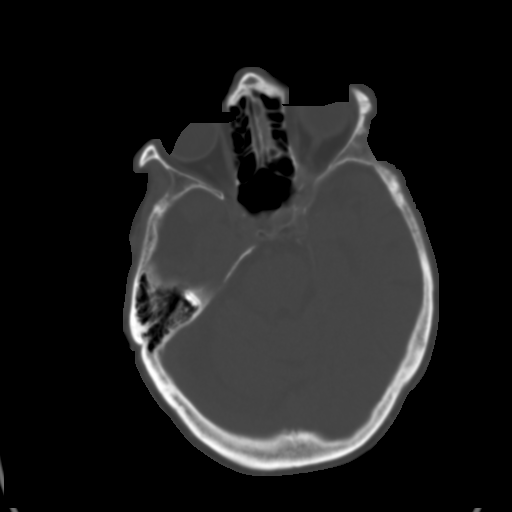
[im 18/41  brain]
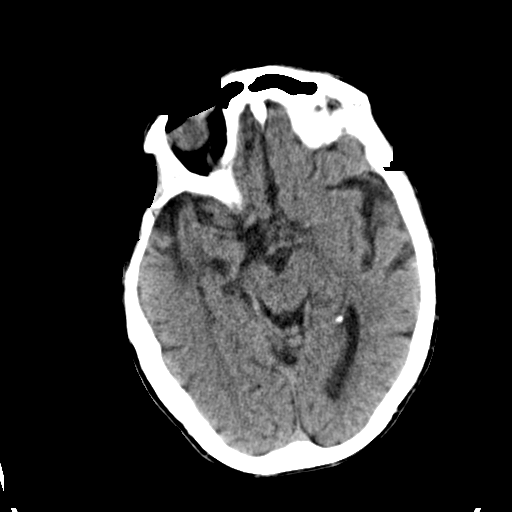
[im 21/41  brain]
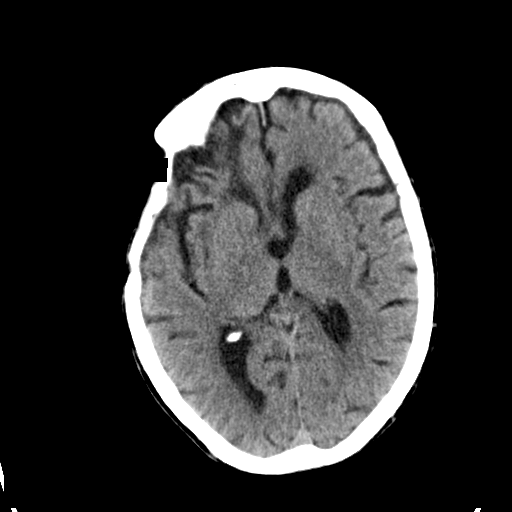
[im 23/41  brain]
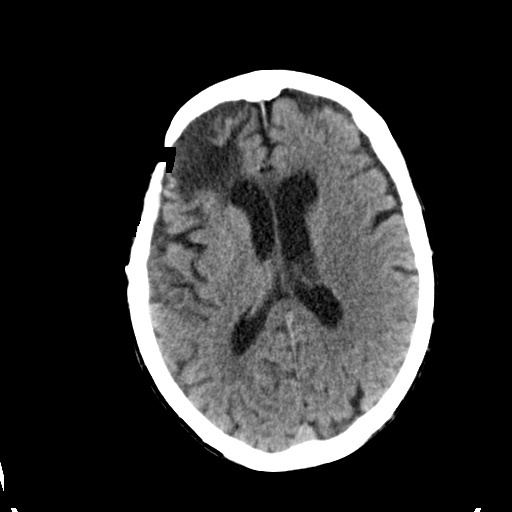
[im 26/41  brain]
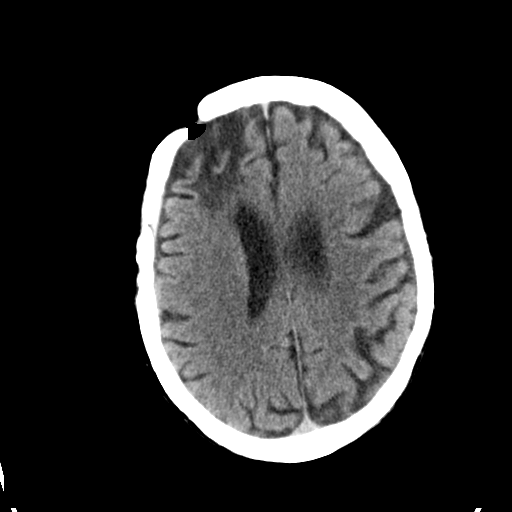
[im 26/41  bone]
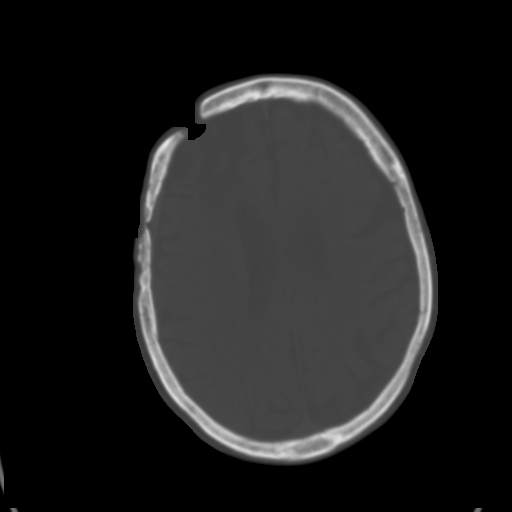
[im 29/41  brain]
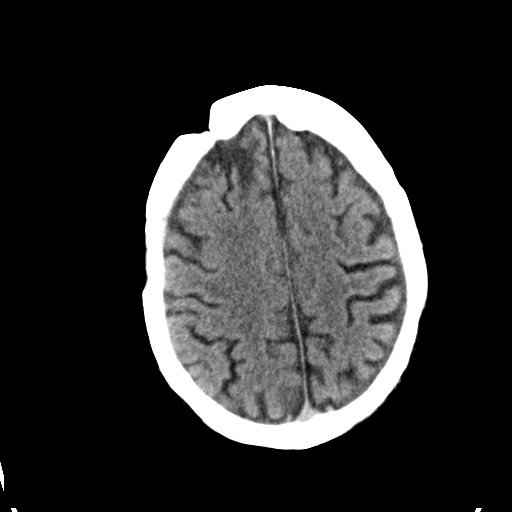
[im 32/41  brain]
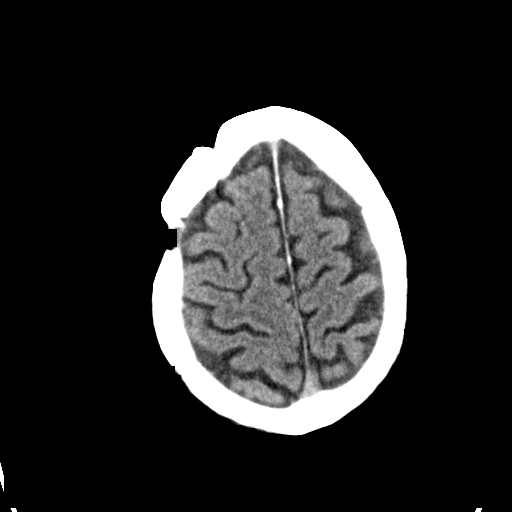
[im 35/41  brain]
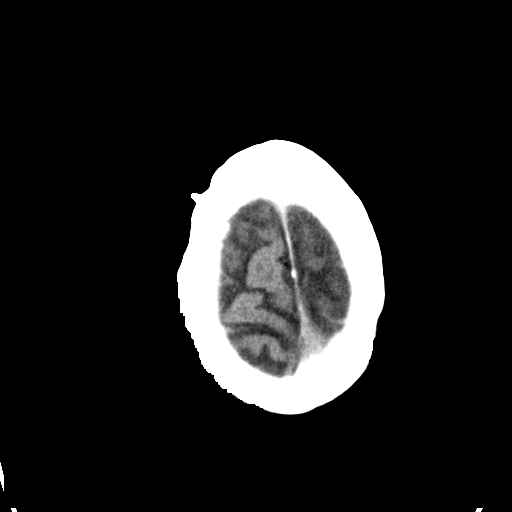
[im 38/41  brain]
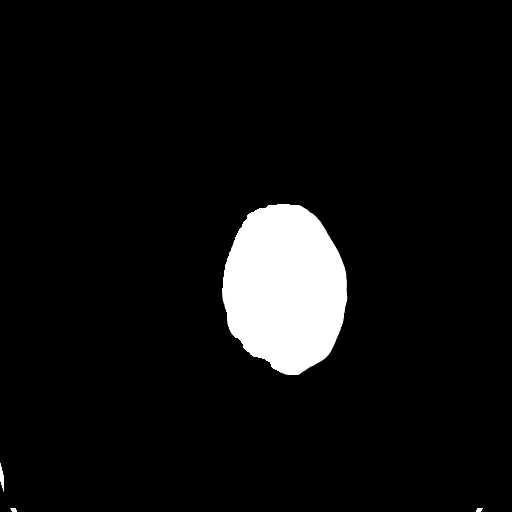
[im 38/41  bone]
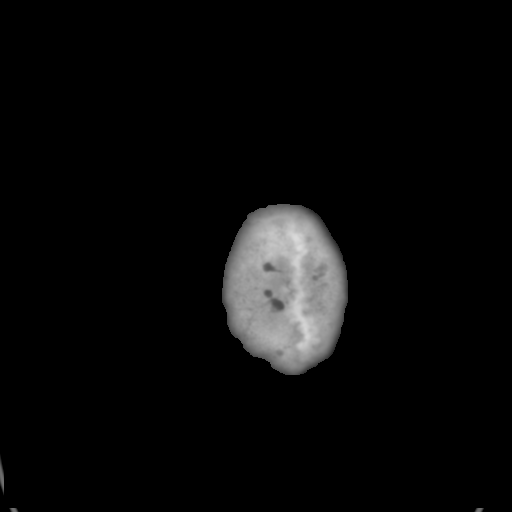

[Series 3: head bone · axial · 0.44mm/px · z∈[-38,-14]mm · 2 of 41 slices shown]
[im 3/41  bone]
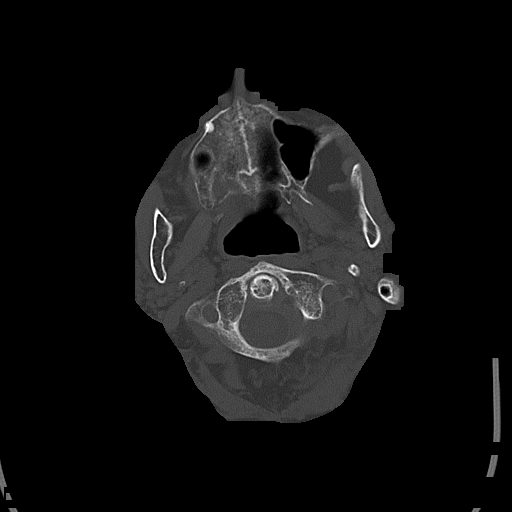
[im 9/41  bone]
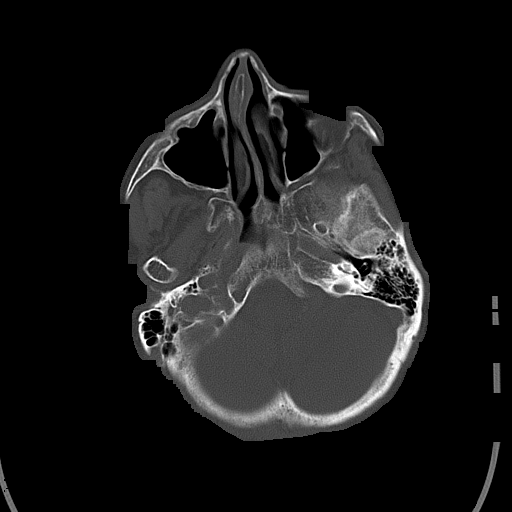

[15 of 30 positions shown; findings below may reference images not displayed]

FINDINGS: Brain: Again noted is generalized age-related parenchymal atrophy
with commensurate dilatation of the ventricles and sulci, slightly
progressed in the interval. There is stable chronic encephalomalacia
within the right frontal and temporal lobe regions.

There is no mass, hemorrhage, edema or other evidence of acute
parenchymal abnormality. No extra-axial hemorrhage.

Vascular: No hyperdense vessel or unexpected calcification. There
are chronic calcified atherosclerotic changes of the large vessels
at the skull base.

Skull: Negative for fracture or acute focal lesion. Stable surgical
changes of right frontotemporal craniotomy.

Sinuses/Orbits: No acute findings.

Other: None.
IMPRESSION: 1. No acute intracranial abnormality. No intracranial mass,
hemorrhage or edema.
2. Chronic findings detailed above.

## 2018-04-15 DIAGNOSIS — J9601 Acute respiratory failure with hypoxia: Secondary | ICD-10-CM | POA: Diagnosis not present

## 2018-04-20 ENCOUNTER — Ambulatory Visit: Payer: Medicare Other | Admitting: Pediatrics

## 2018-05-16 DIAGNOSIS — J9601 Acute respiratory failure with hypoxia: Secondary | ICD-10-CM | POA: Diagnosis not present

## 2018-05-26 DIAGNOSIS — N390 Urinary tract infection, site not specified: Secondary | ICD-10-CM | POA: Diagnosis not present

## 2018-05-26 DIAGNOSIS — T8144XA Sepsis following a procedure, initial encounter: Secondary | ICD-10-CM | POA: Diagnosis not present

## 2018-05-26 DIAGNOSIS — I959 Hypotension, unspecified: Secondary | ICD-10-CM | POA: Diagnosis not present

## 2018-05-26 DIAGNOSIS — Z882 Allergy status to sulfonamides status: Secondary | ICD-10-CM | POA: Diagnosis not present

## 2018-05-26 DIAGNOSIS — Z86711 Personal history of pulmonary embolism: Secondary | ICD-10-CM | POA: Diagnosis not present

## 2018-05-26 DIAGNOSIS — J984 Other disorders of lung: Secondary | ICD-10-CM | POA: Diagnosis not present

## 2018-05-26 DIAGNOSIS — A419 Sepsis, unspecified organism: Secondary | ICD-10-CM | POA: Diagnosis not present

## 2018-05-26 DIAGNOSIS — R7989 Other specified abnormal findings of blood chemistry: Secondary | ICD-10-CM | POA: Diagnosis not present

## 2018-05-26 DIAGNOSIS — I4891 Unspecified atrial fibrillation: Secondary | ICD-10-CM | POA: Diagnosis not present

## 2018-05-26 DIAGNOSIS — I214 Non-ST elevation (NSTEMI) myocardial infarction: Secondary | ICD-10-CM | POA: Diagnosis not present

## 2018-05-26 DIAGNOSIS — Z7982 Long term (current) use of aspirin: Secondary | ICD-10-CM | POA: Diagnosis not present

## 2018-05-26 DIAGNOSIS — Z79899 Other long term (current) drug therapy: Secondary | ICD-10-CM | POA: Diagnosis not present

## 2018-05-26 DIAGNOSIS — T8140XA Infection following a procedure, unspecified, initial encounter: Secondary | ICD-10-CM | POA: Diagnosis not present

## 2018-05-26 DIAGNOSIS — E039 Hypothyroidism, unspecified: Secondary | ICD-10-CM | POA: Diagnosis not present

## 2018-05-26 DIAGNOSIS — R6521 Severe sepsis with septic shock: Secondary | ICD-10-CM | POA: Diagnosis not present

## 2018-06-06 DIAGNOSIS — E038 Other specified hypothyroidism: Secondary | ICD-10-CM | POA: Diagnosis not present

## 2018-06-06 DIAGNOSIS — N390 Urinary tract infection, site not specified: Secondary | ICD-10-CM | POA: Diagnosis not present

## 2018-06-06 DIAGNOSIS — A4189 Other specified sepsis: Secondary | ICD-10-CM | POA: Diagnosis not present

## 2018-06-15 DIAGNOSIS — J9601 Acute respiratory failure with hypoxia: Secondary | ICD-10-CM | POA: Diagnosis not present

## 2018-07-16 DIAGNOSIS — J9601 Acute respiratory failure with hypoxia: Secondary | ICD-10-CM | POA: Diagnosis not present

## 2018-08-15 DIAGNOSIS — J9601 Acute respiratory failure with hypoxia: Secondary | ICD-10-CM | POA: Diagnosis not present

## 2018-08-22 ENCOUNTER — Other Ambulatory Visit: Payer: Self-pay | Admitting: Pediatrics

## 2018-08-22 DIAGNOSIS — E039 Hypothyroidism, unspecified: Secondary | ICD-10-CM

## 2018-08-22 NOTE — Telephone Encounter (Signed)
Last thyroid 12/29/16

## 2018-08-25 NOTE — Telephone Encounter (Signed)
Patient aware and appt made per pt request

## 2018-08-31 ENCOUNTER — Encounter: Payer: Self-pay | Admitting: Pediatrics

## 2018-08-31 ENCOUNTER — Ambulatory Visit (INDEPENDENT_AMBULATORY_CARE_PROVIDER_SITE_OTHER): Payer: Medicare Other | Admitting: Pediatrics

## 2018-08-31 VITALS — BP 109/63 | HR 82 | Temp 96.8°F | Ht 60.0 in | Wt 96.0 lb

## 2018-08-31 DIAGNOSIS — I270 Primary pulmonary hypertension: Secondary | ICD-10-CM | POA: Diagnosis not present

## 2018-08-31 DIAGNOSIS — E039 Hypothyroidism, unspecified: Secondary | ICD-10-CM

## 2018-08-31 NOTE — Progress Notes (Signed)
  Subjective:   Patient ID: Crystal Cisneros, female    DOB: Jul 25, 1925, 85 y.o.   MRN: [id number] CC: Medical Management of Chronic Issues (weight loss)  HPI: Crystal Cisneros is a 85 y.o. female   Recent hospitalization to [facility] for UTI and "heart problems". Admitted 4-5 days before Thanksgiving.   Eating oatmeal with banana in the morning. Boiled fish for lunch and dinner with mashed potatos, sweet peas  History of pulmonary hypertension.  Was following with cardiology.  Patient and family decided against further work-up.  She has oxygen at home that she is been using most of the time.  Relevant past medical, surgical, family and social history reviewed. Allergies and medications reviewed and updated. Social History   Tobacco Use  Smoking Status Never Smoker  Smokeless Tobacco Never Used   ROS: Per HPI   Objective:    BP 109/63   Pulse 82   Temp (!) 96.8 F (36 C) (Oral)   Ht 5' (1.524 m)   Wt 96 lb (43.5 kg)   LMP  (LMP Unknown)   BMI 18.75 kg/m   Wt Readings from Last 3 Encounters:  08/31/18 96 lb (43.5 kg)  03/22/18 101 lb 12.8 oz (46.2 kg)  03/16/18 101 lb (45.8 kg)    O2 sat at rest on room air 90% O2 sat with walking less than 1 minute 83% on room air O2 sat with 2 L of oxygen walking 92%  Gen: NAD, alert, frail EYES: EOMI, no conjunctival injection, or no icterus ENT:  OP without erythema LYMPH: no cervical LAD CV: NRRR, normal S1/S2, no murmur, distal pulses 2+ b/l Resp: CTABL, no wheezes, normal WOB Abd: +BS, soft, NTND Ext: No edema, warm Neuro: Alert and oriented  Assessment & Plan:  Welma was seen today for medical management of chronic issues.  We will request records from [place] recent hospitalization.  Diagnoses and all orders for this visit:  Hypothyroidism, unspecified type Recent weight loss.  Appetite is been pretty good.  Has been tried on medicines to help improve appetite in the past, did not seem to make much  difference.  Will recheck TSH. -     TSH -     BMP8+EGFR  Pulmonary hypertension, primary (HCC) Continue oxygen at home.  Follow up plan: Return in about 4 months (around 12/30/2018). Assunta Found, MD [place]

## 2018-09-01 ENCOUNTER — Other Ambulatory Visit: Payer: Self-pay | Admitting: Pediatrics

## 2018-09-01 DIAGNOSIS — E039 Hypothyroidism, unspecified: Secondary | ICD-10-CM

## 2018-09-01 LAB — BMP8+EGFR
BUN/Creatinine Ratio: 24 (ref 12–28)
BUN: 15 mg/dL (ref 10–36)
CO2: 23 mmol/L (ref 20–29)
CREATININE: 0.63 mg/dL (ref 0.57–1.00)
Calcium: 9.3 mg/dL (ref 8.7–10.3)
Chloride: 98 mmol/L (ref 96–106)
GFR calc Af Amer: 91 mL/min/{1.73_m2} (ref >59)
GFR, EST NON AFRICAN AMERICAN: 79 mL/min/{1.73_m2} (ref >59)
Glucose: 81 mg/dL (ref 65–99)
Potassium: 4.6 mmol/L (ref 3.5–5.2)
Sodium: 141 mmol/L (ref 134–144)

## 2018-09-01 LAB — TSH: TSH: 1.35 u[IU]/mL (ref 0.450–4.500)

## 2018-09-01 NOTE — Telephone Encounter (Signed)
Same dose 

## 2018-09-02 NOTE — Telephone Encounter (Signed)
Significant other aware 

## 2018-09-15 DIAGNOSIS — J9601 Acute respiratory failure with hypoxia: Secondary | ICD-10-CM | POA: Diagnosis not present

## 2018-09-20 ENCOUNTER — Other Ambulatory Visit: Payer: Self-pay

## 2018-10-12 DIAGNOSIS — K5904 Chronic idiopathic constipation: Secondary | ICD-10-CM | POA: Diagnosis not present

## 2018-10-12 DIAGNOSIS — R5382 Chronic fatigue, unspecified: Secondary | ICD-10-CM | POA: Diagnosis not present

## 2018-10-12 DIAGNOSIS — J9611 Chronic respiratory failure with hypoxia: Secondary | ICD-10-CM | POA: Diagnosis not present

## 2018-10-12 DIAGNOSIS — E440 Moderate protein-calorie malnutrition: Secondary | ICD-10-CM | POA: Diagnosis not present

## 2018-10-12 DIAGNOSIS — E039 Hypothyroidism, unspecified: Secondary | ICD-10-CM | POA: Diagnosis not present

## 2018-10-16 DIAGNOSIS — J9601 Acute respiratory failure with hypoxia: Secondary | ICD-10-CM | POA: Diagnosis not present

## 2018-10-24 ENCOUNTER — Telehealth: Payer: Self-pay | Admitting: Pediatrics

## 2018-10-25 NOTE — Telephone Encounter (Signed)
Rx for travel O2 concentrator on providers desk

## 2018-10-28 NOTE — Telephone Encounter (Signed)
LMOVM Rx faxed to [facility] 10/27/18

## 2018-11-10 DIAGNOSIS — Z86711 Personal history of pulmonary embolism: Secondary | ICD-10-CM | POA: Diagnosis not present

## 2018-11-10 DIAGNOSIS — I252 Old myocardial infarction: Secondary | ICD-10-CM | POA: Diagnosis not present

## 2018-11-10 DIAGNOSIS — I4891 Unspecified atrial fibrillation: Secondary | ICD-10-CM | POA: Diagnosis not present

## 2018-11-10 DIAGNOSIS — E440 Moderate protein-calorie malnutrition: Secondary | ICD-10-CM | POA: Diagnosis not present

## 2018-11-10 DIAGNOSIS — Z9981 Dependence on supplemental oxygen: Secondary | ICD-10-CM | POA: Diagnosis not present

## 2018-11-10 DIAGNOSIS — J9611 Chronic respiratory failure with hypoxia: Secondary | ICD-10-CM | POA: Diagnosis not present

## 2018-11-10 DIAGNOSIS — E039 Hypothyroidism, unspecified: Secondary | ICD-10-CM | POA: Diagnosis not present

## 2018-11-14 DIAGNOSIS — R062 Wheezing: Secondary | ICD-10-CM | POA: Diagnosis not present

## 2018-11-14 DIAGNOSIS — J9601 Acute respiratory failure with hypoxia: Secondary | ICD-10-CM | POA: Diagnosis not present

## 2018-11-14 DIAGNOSIS — R05 Cough: Secondary | ICD-10-CM | POA: Diagnosis not present

## 2018-11-16 DIAGNOSIS — Z9981 Dependence on supplemental oxygen: Secondary | ICD-10-CM | POA: Diagnosis not present

## 2018-11-16 DIAGNOSIS — I4891 Unspecified atrial fibrillation: Secondary | ICD-10-CM | POA: Diagnosis not present

## 2018-11-16 DIAGNOSIS — I252 Old myocardial infarction: Secondary | ICD-10-CM | POA: Diagnosis not present

## 2018-11-16 DIAGNOSIS — K5904 Chronic idiopathic constipation: Secondary | ICD-10-CM | POA: Diagnosis not present

## 2018-11-16 DIAGNOSIS — E039 Hypothyroidism, unspecified: Secondary | ICD-10-CM | POA: Diagnosis not present

## 2018-11-16 DIAGNOSIS — J9611 Chronic respiratory failure with hypoxia: Secondary | ICD-10-CM | POA: Diagnosis not present

## 2018-11-16 DIAGNOSIS — E440 Moderate protein-calorie malnutrition: Secondary | ICD-10-CM | POA: Diagnosis not present

## 2018-11-16 DIAGNOSIS — R627 Adult failure to thrive: Secondary | ICD-10-CM | POA: Diagnosis not present

## 2018-11-16 DIAGNOSIS — Z86711 Personal history of pulmonary embolism: Secondary | ICD-10-CM | POA: Diagnosis not present

## 2018-11-17 DIAGNOSIS — E440 Moderate protein-calorie malnutrition: Secondary | ICD-10-CM | POA: Diagnosis not present

## 2018-11-17 DIAGNOSIS — Z86711 Personal history of pulmonary embolism: Secondary | ICD-10-CM | POA: Diagnosis not present

## 2018-11-17 DIAGNOSIS — E039 Hypothyroidism, unspecified: Secondary | ICD-10-CM | POA: Diagnosis not present

## 2018-11-17 DIAGNOSIS — I4891 Unspecified atrial fibrillation: Secondary | ICD-10-CM | POA: Diagnosis not present

## 2018-11-17 DIAGNOSIS — J9611 Chronic respiratory failure with hypoxia: Secondary | ICD-10-CM | POA: Diagnosis not present

## 2018-11-17 DIAGNOSIS — Z9981 Dependence on supplemental oxygen: Secondary | ICD-10-CM | POA: Diagnosis not present

## 2018-11-17 DIAGNOSIS — I252 Old myocardial infarction: Secondary | ICD-10-CM | POA: Diagnosis not present

## 2018-11-21 DIAGNOSIS — E039 Hypothyroidism, unspecified: Secondary | ICD-10-CM | POA: Diagnosis not present

## 2018-11-21 DIAGNOSIS — E440 Moderate protein-calorie malnutrition: Secondary | ICD-10-CM | POA: Diagnosis not present

## 2018-11-21 DIAGNOSIS — I252 Old myocardial infarction: Secondary | ICD-10-CM | POA: Diagnosis not present

## 2018-11-21 DIAGNOSIS — J9611 Chronic respiratory failure with hypoxia: Secondary | ICD-10-CM | POA: Diagnosis not present

## 2018-11-21 DIAGNOSIS — Z9981 Dependence on supplemental oxygen: Secondary | ICD-10-CM | POA: Diagnosis not present

## 2018-11-21 DIAGNOSIS — I4891 Unspecified atrial fibrillation: Secondary | ICD-10-CM | POA: Diagnosis not present

## 2018-11-21 DIAGNOSIS — Z86711 Personal history of pulmonary embolism: Secondary | ICD-10-CM | POA: Diagnosis not present

## 2018-11-23 DIAGNOSIS — E039 Hypothyroidism, unspecified: Secondary | ICD-10-CM | POA: Diagnosis not present

## 2018-11-23 DIAGNOSIS — Z9981 Dependence on supplemental oxygen: Secondary | ICD-10-CM | POA: Diagnosis not present

## 2018-11-23 DIAGNOSIS — I252 Old myocardial infarction: Secondary | ICD-10-CM | POA: Diagnosis not present

## 2018-11-23 DIAGNOSIS — Z86711 Personal history of pulmonary embolism: Secondary | ICD-10-CM | POA: Diagnosis not present

## 2018-11-23 DIAGNOSIS — J9611 Chronic respiratory failure with hypoxia: Secondary | ICD-10-CM | POA: Diagnosis not present

## 2018-11-23 DIAGNOSIS — I4891 Unspecified atrial fibrillation: Secondary | ICD-10-CM | POA: Diagnosis not present

## 2018-11-23 DIAGNOSIS — E440 Moderate protein-calorie malnutrition: Secondary | ICD-10-CM | POA: Diagnosis not present

## 2018-11-25 DIAGNOSIS — J302 Other seasonal allergic rhinitis: Secondary | ICD-10-CM | POA: Diagnosis not present

## 2018-11-25 DIAGNOSIS — B359 Dermatophytosis, unspecified: Secondary | ICD-10-CM | POA: Diagnosis not present

## 2018-11-25 DIAGNOSIS — H1013 Acute atopic conjunctivitis, bilateral: Secondary | ICD-10-CM | POA: Diagnosis not present

## 2018-11-25 DIAGNOSIS — H5789 Other specified disorders of eye and adnexa: Secondary | ICD-10-CM | POA: Diagnosis not present

## 2018-11-28 DIAGNOSIS — Z86711 Personal history of pulmonary embolism: Secondary | ICD-10-CM | POA: Diagnosis not present

## 2018-11-28 DIAGNOSIS — E440 Moderate protein-calorie malnutrition: Secondary | ICD-10-CM | POA: Diagnosis not present

## 2018-11-28 DIAGNOSIS — J9611 Chronic respiratory failure with hypoxia: Secondary | ICD-10-CM | POA: Diagnosis not present

## 2018-11-28 DIAGNOSIS — I252 Old myocardial infarction: Secondary | ICD-10-CM | POA: Diagnosis not present

## 2018-11-28 DIAGNOSIS — Z9981 Dependence on supplemental oxygen: Secondary | ICD-10-CM | POA: Diagnosis not present

## 2018-11-28 DIAGNOSIS — I4891 Unspecified atrial fibrillation: Secondary | ICD-10-CM | POA: Diagnosis not present

## 2018-11-28 DIAGNOSIS — E039 Hypothyroidism, unspecified: Secondary | ICD-10-CM | POA: Diagnosis not present

## 2018-11-30 DIAGNOSIS — Z86711 Personal history of pulmonary embolism: Secondary | ICD-10-CM | POA: Diagnosis not present

## 2018-11-30 DIAGNOSIS — J9611 Chronic respiratory failure with hypoxia: Secondary | ICD-10-CM | POA: Diagnosis not present

## 2018-11-30 DIAGNOSIS — Z9981 Dependence on supplemental oxygen: Secondary | ICD-10-CM | POA: Diagnosis not present

## 2018-11-30 DIAGNOSIS — E039 Hypothyroidism, unspecified: Secondary | ICD-10-CM | POA: Diagnosis not present

## 2018-11-30 DIAGNOSIS — I252 Old myocardial infarction: Secondary | ICD-10-CM | POA: Diagnosis not present

## 2018-11-30 DIAGNOSIS — E440 Moderate protein-calorie malnutrition: Secondary | ICD-10-CM | POA: Diagnosis not present

## 2018-11-30 DIAGNOSIS — I4891 Unspecified atrial fibrillation: Secondary | ICD-10-CM | POA: Diagnosis not present

## 2018-12-02 DIAGNOSIS — I252 Old myocardial infarction: Secondary | ICD-10-CM | POA: Diagnosis not present

## 2018-12-02 DIAGNOSIS — Z9981 Dependence on supplemental oxygen: Secondary | ICD-10-CM | POA: Diagnosis not present

## 2018-12-02 DIAGNOSIS — Z86711 Personal history of pulmonary embolism: Secondary | ICD-10-CM | POA: Diagnosis not present

## 2018-12-02 DIAGNOSIS — E039 Hypothyroidism, unspecified: Secondary | ICD-10-CM | POA: Diagnosis not present

## 2018-12-02 DIAGNOSIS — E440 Moderate protein-calorie malnutrition: Secondary | ICD-10-CM | POA: Diagnosis not present

## 2018-12-02 DIAGNOSIS — I4891 Unspecified atrial fibrillation: Secondary | ICD-10-CM | POA: Diagnosis not present

## 2018-12-02 DIAGNOSIS — J9611 Chronic respiratory failure with hypoxia: Secondary | ICD-10-CM | POA: Diagnosis not present

## 2018-12-05 DIAGNOSIS — E039 Hypothyroidism, unspecified: Secondary | ICD-10-CM | POA: Diagnosis not present

## 2018-12-05 DIAGNOSIS — I252 Old myocardial infarction: Secondary | ICD-10-CM | POA: Diagnosis not present

## 2018-12-05 DIAGNOSIS — Z9981 Dependence on supplemental oxygen: Secondary | ICD-10-CM | POA: Diagnosis not present

## 2018-12-05 DIAGNOSIS — J9611 Chronic respiratory failure with hypoxia: Secondary | ICD-10-CM | POA: Diagnosis not present

## 2018-12-05 DIAGNOSIS — Z86711 Personal history of pulmonary embolism: Secondary | ICD-10-CM | POA: Diagnosis not present

## 2018-12-05 DIAGNOSIS — E440 Moderate protein-calorie malnutrition: Secondary | ICD-10-CM | POA: Diagnosis not present

## 2018-12-05 DIAGNOSIS — I4891 Unspecified atrial fibrillation: Secondary | ICD-10-CM | POA: Diagnosis not present

## 2018-12-06 DIAGNOSIS — I252 Old myocardial infarction: Secondary | ICD-10-CM | POA: Diagnosis not present

## 2018-12-06 DIAGNOSIS — I4891 Unspecified atrial fibrillation: Secondary | ICD-10-CM | POA: Diagnosis not present

## 2018-12-06 DIAGNOSIS — E039 Hypothyroidism, unspecified: Secondary | ICD-10-CM | POA: Diagnosis not present

## 2018-12-06 DIAGNOSIS — Z86711 Personal history of pulmonary embolism: Secondary | ICD-10-CM | POA: Diagnosis not present

## 2018-12-06 DIAGNOSIS — Z9981 Dependence on supplemental oxygen: Secondary | ICD-10-CM | POA: Diagnosis not present

## 2018-12-06 DIAGNOSIS — E440 Moderate protein-calorie malnutrition: Secondary | ICD-10-CM | POA: Diagnosis not present

## 2018-12-06 DIAGNOSIS — J9611 Chronic respiratory failure with hypoxia: Secondary | ICD-10-CM | POA: Diagnosis not present

## 2018-12-15 DIAGNOSIS — R05 Cough: Secondary | ICD-10-CM | POA: Diagnosis not present

## 2018-12-15 DIAGNOSIS — R062 Wheezing: Secondary | ICD-10-CM | POA: Diagnosis not present

## 2018-12-15 DIAGNOSIS — J9601 Acute respiratory failure with hypoxia: Secondary | ICD-10-CM | POA: Diagnosis not present

## 2018-12-16 DIAGNOSIS — I4891 Unspecified atrial fibrillation: Secondary | ICD-10-CM | POA: Diagnosis not present

## 2018-12-16 DIAGNOSIS — E039 Hypothyroidism, unspecified: Secondary | ICD-10-CM | POA: Diagnosis not present

## 2018-12-16 DIAGNOSIS — Z9981 Dependence on supplemental oxygen: Secondary | ICD-10-CM | POA: Diagnosis not present

## 2018-12-16 DIAGNOSIS — J9611 Chronic respiratory failure with hypoxia: Secondary | ICD-10-CM | POA: Diagnosis not present

## 2018-12-16 DIAGNOSIS — I252 Old myocardial infarction: Secondary | ICD-10-CM | POA: Diagnosis not present

## 2018-12-16 DIAGNOSIS — Z86711 Personal history of pulmonary embolism: Secondary | ICD-10-CM | POA: Diagnosis not present

## 2018-12-16 DIAGNOSIS — E440 Moderate protein-calorie malnutrition: Secondary | ICD-10-CM | POA: Diagnosis not present

## 2018-12-19 DIAGNOSIS — Z86711 Personal history of pulmonary embolism: Secondary | ICD-10-CM | POA: Diagnosis not present

## 2018-12-19 DIAGNOSIS — I252 Old myocardial infarction: Secondary | ICD-10-CM | POA: Diagnosis not present

## 2018-12-19 DIAGNOSIS — E440 Moderate protein-calorie malnutrition: Secondary | ICD-10-CM | POA: Diagnosis not present

## 2018-12-19 DIAGNOSIS — E039 Hypothyroidism, unspecified: Secondary | ICD-10-CM | POA: Diagnosis not present

## 2018-12-19 DIAGNOSIS — I4891 Unspecified atrial fibrillation: Secondary | ICD-10-CM | POA: Diagnosis not present

## 2018-12-19 DIAGNOSIS — J9611 Chronic respiratory failure with hypoxia: Secondary | ICD-10-CM | POA: Diagnosis not present

## 2018-12-19 DIAGNOSIS — Z9981 Dependence on supplemental oxygen: Secondary | ICD-10-CM | POA: Diagnosis not present

## 2018-12-20 DIAGNOSIS — I4891 Unspecified atrial fibrillation: Secondary | ICD-10-CM | POA: Diagnosis not present

## 2018-12-20 DIAGNOSIS — Z9981 Dependence on supplemental oxygen: Secondary | ICD-10-CM | POA: Diagnosis not present

## 2018-12-20 DIAGNOSIS — I252 Old myocardial infarction: Secondary | ICD-10-CM | POA: Diagnosis not present

## 2018-12-20 DIAGNOSIS — E440 Moderate protein-calorie malnutrition: Secondary | ICD-10-CM | POA: Diagnosis not present

## 2018-12-20 DIAGNOSIS — Z86711 Personal history of pulmonary embolism: Secondary | ICD-10-CM | POA: Diagnosis not present

## 2018-12-20 DIAGNOSIS — E039 Hypothyroidism, unspecified: Secondary | ICD-10-CM | POA: Diagnosis not present

## 2018-12-20 DIAGNOSIS — J9611 Chronic respiratory failure with hypoxia: Secondary | ICD-10-CM | POA: Diagnosis not present

## 2018-12-23 DIAGNOSIS — I252 Old myocardial infarction: Secondary | ICD-10-CM | POA: Diagnosis not present

## 2018-12-23 DIAGNOSIS — Z86711 Personal history of pulmonary embolism: Secondary | ICD-10-CM | POA: Diagnosis not present

## 2018-12-23 DIAGNOSIS — E440 Moderate protein-calorie malnutrition: Secondary | ICD-10-CM | POA: Diagnosis not present

## 2018-12-23 DIAGNOSIS — J9611 Chronic respiratory failure with hypoxia: Secondary | ICD-10-CM | POA: Diagnosis not present

## 2018-12-23 DIAGNOSIS — I4891 Unspecified atrial fibrillation: Secondary | ICD-10-CM | POA: Diagnosis not present

## 2018-12-23 DIAGNOSIS — Z9981 Dependence on supplemental oxygen: Secondary | ICD-10-CM | POA: Diagnosis not present

## 2018-12-23 DIAGNOSIS — E039 Hypothyroidism, unspecified: Secondary | ICD-10-CM | POA: Diagnosis not present

## 2018-12-28 DIAGNOSIS — Z86711 Personal history of pulmonary embolism: Secondary | ICD-10-CM | POA: Diagnosis not present

## 2018-12-28 DIAGNOSIS — I4891 Unspecified atrial fibrillation: Secondary | ICD-10-CM | POA: Diagnosis not present

## 2018-12-28 DIAGNOSIS — Z9981 Dependence on supplemental oxygen: Secondary | ICD-10-CM | POA: Diagnosis not present

## 2018-12-28 DIAGNOSIS — J9611 Chronic respiratory failure with hypoxia: Secondary | ICD-10-CM | POA: Diagnosis not present

## 2018-12-28 DIAGNOSIS — E039 Hypothyroidism, unspecified: Secondary | ICD-10-CM | POA: Diagnosis not present

## 2018-12-28 DIAGNOSIS — E440 Moderate protein-calorie malnutrition: Secondary | ICD-10-CM | POA: Diagnosis not present

## 2018-12-28 DIAGNOSIS — I252 Old myocardial infarction: Secondary | ICD-10-CM | POA: Diagnosis not present

## 2018-12-30 ENCOUNTER — Ambulatory Visit: Payer: Medicare Other | Admitting: Family Medicine

## 2018-12-30 DIAGNOSIS — I252 Old myocardial infarction: Secondary | ICD-10-CM | POA: Diagnosis not present

## 2018-12-30 DIAGNOSIS — Z86711 Personal history of pulmonary embolism: Secondary | ICD-10-CM | POA: Diagnosis not present

## 2018-12-30 DIAGNOSIS — E440 Moderate protein-calorie malnutrition: Secondary | ICD-10-CM | POA: Diagnosis not present

## 2018-12-30 DIAGNOSIS — J9611 Chronic respiratory failure with hypoxia: Secondary | ICD-10-CM | POA: Diagnosis not present

## 2018-12-30 DIAGNOSIS — Z9981 Dependence on supplemental oxygen: Secondary | ICD-10-CM | POA: Diagnosis not present

## 2018-12-30 DIAGNOSIS — E039 Hypothyroidism, unspecified: Secondary | ICD-10-CM | POA: Diagnosis not present

## 2018-12-30 DIAGNOSIS — I4891 Unspecified atrial fibrillation: Secondary | ICD-10-CM | POA: Diagnosis not present

## 2019-01-02 DIAGNOSIS — E039 Hypothyroidism, unspecified: Secondary | ICD-10-CM | POA: Diagnosis not present

## 2019-01-02 DIAGNOSIS — I4891 Unspecified atrial fibrillation: Secondary | ICD-10-CM | POA: Diagnosis not present

## 2019-01-02 DIAGNOSIS — E440 Moderate protein-calorie malnutrition: Secondary | ICD-10-CM | POA: Diagnosis not present

## 2019-01-02 DIAGNOSIS — Z9981 Dependence on supplemental oxygen: Secondary | ICD-10-CM | POA: Diagnosis not present

## 2019-01-02 DIAGNOSIS — I252 Old myocardial infarction: Secondary | ICD-10-CM | POA: Diagnosis not present

## 2019-01-02 DIAGNOSIS — Z86711 Personal history of pulmonary embolism: Secondary | ICD-10-CM | POA: Diagnosis not present

## 2019-01-02 DIAGNOSIS — J9611 Chronic respiratory failure with hypoxia: Secondary | ICD-10-CM | POA: Diagnosis not present

## 2019-01-05 DIAGNOSIS — E039 Hypothyroidism, unspecified: Secondary | ICD-10-CM | POA: Diagnosis not present

## 2019-01-05 DIAGNOSIS — Z86711 Personal history of pulmonary embolism: Secondary | ICD-10-CM | POA: Diagnosis not present

## 2019-01-05 DIAGNOSIS — Z9981 Dependence on supplemental oxygen: Secondary | ICD-10-CM | POA: Diagnosis not present

## 2019-01-05 DIAGNOSIS — I252 Old myocardial infarction: Secondary | ICD-10-CM | POA: Diagnosis not present

## 2019-01-05 DIAGNOSIS — I4891 Unspecified atrial fibrillation: Secondary | ICD-10-CM | POA: Diagnosis not present

## 2019-01-05 DIAGNOSIS — E440 Moderate protein-calorie malnutrition: Secondary | ICD-10-CM | POA: Diagnosis not present

## 2019-01-05 DIAGNOSIS — J9611 Chronic respiratory failure with hypoxia: Secondary | ICD-10-CM | POA: Diagnosis not present

## 2019-01-06 DIAGNOSIS — Z86711 Personal history of pulmonary embolism: Secondary | ICD-10-CM | POA: Diagnosis not present

## 2019-01-06 DIAGNOSIS — E440 Moderate protein-calorie malnutrition: Secondary | ICD-10-CM | POA: Diagnosis not present

## 2019-01-06 DIAGNOSIS — E039 Hypothyroidism, unspecified: Secondary | ICD-10-CM | POA: Diagnosis not present

## 2019-01-06 DIAGNOSIS — I4891 Unspecified atrial fibrillation: Secondary | ICD-10-CM | POA: Diagnosis not present

## 2019-01-06 DIAGNOSIS — Z9981 Dependence on supplemental oxygen: Secondary | ICD-10-CM | POA: Diagnosis not present

## 2019-01-06 DIAGNOSIS — I252 Old myocardial infarction: Secondary | ICD-10-CM | POA: Diagnosis not present

## 2019-01-06 DIAGNOSIS — J9611 Chronic respiratory failure with hypoxia: Secondary | ICD-10-CM | POA: Diagnosis not present

## 2019-01-09 DIAGNOSIS — R5383 Other fatigue: Secondary | ICD-10-CM | POA: Diagnosis not present

## 2019-01-09 DIAGNOSIS — R42 Dizziness and giddiness: Secondary | ICD-10-CM | POA: Diagnosis not present

## 2019-01-09 DIAGNOSIS — I509 Heart failure, unspecified: Secondary | ICD-10-CM | POA: Diagnosis not present

## 2019-01-09 DIAGNOSIS — E079 Disorder of thyroid, unspecified: Secondary | ICD-10-CM | POA: Diagnosis not present

## 2019-01-09 DIAGNOSIS — I6523 Occlusion and stenosis of bilateral carotid arteries: Secondary | ICD-10-CM | POA: Diagnosis not present

## 2019-01-09 DIAGNOSIS — J9621 Acute and chronic respiratory failure with hypoxia: Secondary | ICD-10-CM | POA: Diagnosis not present

## 2019-01-09 DIAGNOSIS — I214 Non-ST elevation (NSTEMI) myocardial infarction: Secondary | ICD-10-CM | POA: Diagnosis not present

## 2019-01-09 DIAGNOSIS — R0602 Shortness of breath: Secondary | ICD-10-CM | POA: Diagnosis not present

## 2019-01-09 DIAGNOSIS — J8410 Pulmonary fibrosis, unspecified: Secondary | ICD-10-CM | POA: Diagnosis not present

## 2019-01-09 DIAGNOSIS — I452 Bifascicular block: Secondary | ICD-10-CM | POA: Diagnosis not present

## 2019-01-09 DIAGNOSIS — Z66 Do not resuscitate: Secondary | ICD-10-CM | POA: Diagnosis not present

## 2019-01-09 DIAGNOSIS — S0990XA Unspecified injury of head, initial encounter: Secondary | ICD-10-CM | POA: Diagnosis not present

## 2019-01-09 DIAGNOSIS — I272 Pulmonary hypertension, unspecified: Secondary | ICD-10-CM | POA: Diagnosis not present

## 2019-01-09 DIAGNOSIS — B379 Candidiasis, unspecified: Secondary | ICD-10-CM | POA: Diagnosis not present

## 2019-01-09 DIAGNOSIS — Z515 Encounter for palliative care: Secondary | ICD-10-CM | POA: Diagnosis not present

## 2019-01-09 DIAGNOSIS — Z880 Allergy status to penicillin: Secondary | ICD-10-CM | POA: Diagnosis not present

## 2019-01-09 DIAGNOSIS — J849 Interstitial pulmonary disease, unspecified: Secondary | ICD-10-CM | POA: Diagnosis not present

## 2019-01-09 DIAGNOSIS — E46 Unspecified protein-calorie malnutrition: Secondary | ICD-10-CM | POA: Diagnosis not present

## 2019-01-09 DIAGNOSIS — I951 Orthostatic hypotension: Secondary | ICD-10-CM | POA: Diagnosis not present

## 2019-01-09 DIAGNOSIS — J9601 Acute respiratory failure with hypoxia: Secondary | ICD-10-CM | POA: Diagnosis not present

## 2019-01-09 DIAGNOSIS — Z7982 Long term (current) use of aspirin: Secondary | ICD-10-CM | POA: Diagnosis not present

## 2019-01-09 DIAGNOSIS — E039 Hypothyroidism, unspecified: Secondary | ICD-10-CM | POA: Diagnosis not present

## 2019-01-09 DIAGNOSIS — R0902 Hypoxemia: Secondary | ICD-10-CM | POA: Diagnosis not present

## 2019-01-09 DIAGNOSIS — R001 Bradycardia, unspecified: Secondary | ICD-10-CM | POA: Diagnosis not present

## 2019-01-09 DIAGNOSIS — Z7189 Other specified counseling: Secondary | ICD-10-CM | POA: Diagnosis not present

## 2019-01-09 DIAGNOSIS — R55 Syncope and collapse: Secondary | ICD-10-CM | POA: Diagnosis not present

## 2019-01-09 DIAGNOSIS — R531 Weakness: Secondary | ICD-10-CM | POA: Diagnosis not present

## 2019-01-14 DIAGNOSIS — R05 Cough: Secondary | ICD-10-CM | POA: Diagnosis not present

## 2019-01-14 DIAGNOSIS — R062 Wheezing: Secondary | ICD-10-CM | POA: Diagnosis not present

## 2019-01-14 DIAGNOSIS — J9601 Acute respiratory failure with hypoxia: Secondary | ICD-10-CM | POA: Diagnosis not present

## 2019-01-23 DEATH — deceased
